# Patient Record
Sex: Female | Born: 1955 | Race: White | Hispanic: No | Marital: Married | Smoking: Never smoker
Health system: Southern US, Community
[De-identification: ages and names within clinical notes are randomized; demographics above are authoritative.]

## PROBLEM LIST (undated history)

## (undated) DIAGNOSIS — I10 Essential (primary) hypertension: Secondary | ICD-10-CM

## (undated) DIAGNOSIS — M797 Fibromyalgia: Secondary | ICD-10-CM

## (undated) DIAGNOSIS — M069 Rheumatoid arthritis, unspecified: Secondary | ICD-10-CM

## (undated) DIAGNOSIS — M8580 Other specified disorders of bone density and structure, unspecified site: Secondary | ICD-10-CM

## (undated) DIAGNOSIS — E119 Type 2 diabetes mellitus without complications: Secondary | ICD-10-CM

## (undated) HISTORY — PX: COLONOSCOPY: SHX174

## (undated) HISTORY — DX: Rheumatoid arthritis, unspecified: M06.9

## (undated) HISTORY — DX: Essential (primary) hypertension: I10

## (undated) HISTORY — PX: BREAST EXCISIONAL BIOPSY: SUR124

## (undated) HISTORY — DX: Other specified disorders of bone density and structure, unspecified site: M85.80

## (undated) HISTORY — PX: BREAST SURGERY: SHX581

## (undated) HISTORY — DX: Fibromyalgia: M79.7

## (undated) HISTORY — DX: Type 2 diabetes mellitus without complications: E11.9

## (undated) HISTORY — PX: ESOPHAGOGASTRODUODENOSCOPY: SHX1529

## (undated) HISTORY — PX: PARTIAL HYSTERECTOMY: SHX80

## (undated) HISTORY — PX: WISDOM TOOTH EXTRACTION: SHX21

## (undated) HISTORY — PX: TUBAL LIGATION: SHX77

---

## 2008-09-01 ENCOUNTER — Ambulatory Visit: Payer: Self-pay | Admitting: Cardiology

## 2008-09-10 ENCOUNTER — Ambulatory Visit: Payer: Self-pay | Admitting: Cardiology

## 2009-06-03 ENCOUNTER — Ambulatory Visit: Payer: Self-pay | Admitting: Cardiology

## 2011-04-18 NOTE — Assessment & Plan Note (Signed)
[facility]                          [facility]ICE NOTE   Betty, Wright                    MRN:          [id number]  DATE:09/01/2008                            DOB:          1956/07/08    REFERRING PHYSICIAN:  Kirstie Peri, MD   REASON FOR VISIT:  Palpitations.   HISTORY OF PRESENT ILLNESS:  Betty Wright is a 55 year old female patient  with no known history of CAD, but significant risk factors including  diabetes mellitus (x1 year) and dyslipidemia, who presents to the office  today for further evaluation of palpitations.  She had noted  palpitations for many years.  Over the last year, her palpitations seem  to have gotten worse, she notes them especially with lying down at  night.  It feels like her heart beat is irregular.  She also notes  skipped beats as well as some fast heart beats.  She denies any  associated syncope or near syncope or lightheadedness.  She denies any  associated chest pain or shortness of breath.  She denies any history of  exertional chest heaviness or tightness, arm or jaw discomfort, nausea,  diaphoresis, or shortness of breath.  She denies orthopnea, PND, or  pedal edema.  She does note an episode of dysphagia a couple of weeks  ago.  She apparently has a significant history of GERD.  She did have  some associated chest pressure and near syncope with this episode of  dysphagia, but it clearly happened while she was eating and she has not  had any recurrence since then.  She has had some episodes of positional  dizziness.  This does not seem to be related to her palpitations.  She  saw Dr. Sherryll Burger to evaluate her palpitations.  She wore a monitor for 2  weeks.  We have the final report today, but no strips to review.  The  summary does indicate that she basically had sinus rhythm and sinus  tachycardia with occasional PACs and atrial runs and sinus arrhythmia.  The patient was offered a beta-blocker, but  she preferred to have  further evaluation with a cardiologist first.   PAST MEDICAL HISTORY:  She has a history of diabetes mellitus x1 year.  She has a prior history of gestational diabetes.  She has hyperlipidemia  and currently takes medical therapy for this.  She has a history of  GERD, allergic rhinitis, fibromyalgia, and degenerative joint disease.  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  She is status post left breast lumpectomy and C-section as well  as bilateral tubal ligation.  She had scarlet fever as a child and also  has lactose intolerance.  The patient notes a history of stress testing  about 8 years ago secondary to chest pain.  She tells me this was  normal.   ALLERGIES:  PENICILLIN causes a rash.   MEDICATIONS:  Optivar eye drops b.i.d., Pepcid b.i.d., Claritin 10 mg  daily, Simvastatin 20 mg daily, Aspirin 81 mg daily, Vitamin D 1000 mg  b.i.d., Vitamin B6 100 mg daily,  Calcium plus  vitamin D 250 international units daily, Metformin 500 mg  half tablet daily, Triamterene/hydrochlorothiazide p.r.n.   SOCIAL HISTORY:  The patient denies ever smoking cigarettes.  She does  not abuse alcohol.  She is married with two children.  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   FAMILY HISTORY:  Unknown secondary to adoption.   REVIEW OF SYSTEMS:  Please see HPI.  She denies any fevers, chills,  cough, melena, hematochezia, hematuria, or dysuria.  Rest of the review  of systems is negative.   PHYSICAL EXAMINATION:  GENERAL:  She is a well-nourished, well-developed  female.  VITAL SIGNS:  Blood pressure is 145/90, pulse 81, weight 152.8 pounds.  HEENT:  Normal.  NECK:  Without JVD.  LYMPHATIC:  Without lymphadenopathy.  ENDOCRINE:  Without thyromegaly.  CARDIAC:  Normal S1 and S2.  Regular rate and rhythm.  LUNGS:  Clear to auscultation bilaterally.  ABDOMEN:  Soft, nontender.  EXTREMITIES:  Without edema.  NEUROLOGIC:  She is  alert and oriented x3.  Cranial nerves II through  XII grossly intact.  VASCULAR:  Carotids without bruits bilaterally.  Dorsalis pedis and  posterior tibialis pulses are 2+ bilaterally.   Electrocardiogram in the office today reveals sinus rhythm with a heart  rate of 74, normal axis, no acute changes.   ASSESSMENT/PLAN:  1. Palpitations in a 55 year old female patient with a history of      diabetes mellitus and hyperlipidemia.  Recent event monitoring      apparently demonstrated premature atrial contractions and brief      atrial tachycardia.  As outlined above, we will try to obtain her      strips from Dr. Margaretmary Eddy office.  The patient is not significantly      symptomatic with her palpitations to the point that she wants      medication.  I had a long discussion with her today regarding      possibly taking daily beta-blocker versus p.r.n. beta-blockers.  At      this point in time, she prefers to take nothing.  This is certainly      reasonable and I explained to her that she is not required to take      any medication for this benign arrhythmia.  She will have an      echocardiogram performed to assess left ventricular structure and      function.  We will bring her back in several months to reassess her      symptoms and make further recommendations at that time if felt to      be necessary.  2. Elevated blood pressure.  She typically has blood pressures in the      120/80 range.  This is likely a spurious result and she can follow      up further with Dr. Sherryll Burger.   DISPOSITION:  The patient will be brought back in followup with Dr.  Diona Browner in the next 6 months or sooner p.r.n.  The patient was also  interviewed and examined by Dr. Diona Browner.      Tereso Newcomer, PA-C  Electronically Signed      Jonelle Sidle, MD  Electronically Signed   SW/MedQ  DD: 09/01/2008  DT: 09/02/2008  Job #: [id number]   cc:   Kirstie Peri, MD

## 2017-01-18 ENCOUNTER — Ambulatory Visit (INDEPENDENT_AMBULATORY_CARE_PROVIDER_SITE_OTHER): Payer: 59 | Admitting: Otolaryngology

## 2017-01-18 DIAGNOSIS — J342 Deviated nasal septum: Secondary | ICD-10-CM

## 2017-01-18 DIAGNOSIS — J343 Hypertrophy of nasal turbinates: Secondary | ICD-10-CM

## 2017-01-18 DIAGNOSIS — J310 Chronic rhinitis: Secondary | ICD-10-CM | POA: Diagnosis not present

## 2017-01-19 ENCOUNTER — Other Ambulatory Visit (INDEPENDENT_AMBULATORY_CARE_PROVIDER_SITE_OTHER): Payer: Self-pay | Admitting: Otolaryngology

## 2017-01-19 DIAGNOSIS — J329 Chronic sinusitis, unspecified: Secondary | ICD-10-CM

## 2017-01-29 ENCOUNTER — Ambulatory Visit (HOSPITAL_COMMUNITY)
Admission: RE | Admit: 2017-01-29 | Discharge: 2017-01-29 | Disposition: A | Discharge status: Home / Self Care | Payer: 59 | Source: Ambulatory Visit | Attending: Otolaryngology | Admitting: Otolaryngology

## 2017-01-29 DIAGNOSIS — J342 Deviated nasal septum: Secondary | ICD-10-CM | POA: Insufficient documentation

## 2017-01-29 DIAGNOSIS — J329 Chronic sinusitis, unspecified: Secondary | ICD-10-CM

## 2017-01-29 DIAGNOSIS — J3489 Other specified disorders of nose and nasal sinuses: Secondary | ICD-10-CM | POA: Diagnosis not present

## 2017-01-29 DIAGNOSIS — J320 Chronic maxillary sinusitis: Secondary | ICD-10-CM | POA: Diagnosis present

## 2017-02-08 ENCOUNTER — Ambulatory Visit (INDEPENDENT_AMBULATORY_CARE_PROVIDER_SITE_OTHER): Payer: 59 | Admitting: Otolaryngology

## 2017-02-08 DIAGNOSIS — J310 Chronic rhinitis: Secondary | ICD-10-CM

## 2017-02-08 DIAGNOSIS — J342 Deviated nasal septum: Secondary | ICD-10-CM

## 2017-02-08 DIAGNOSIS — J343 Hypertrophy of nasal turbinates: Secondary | ICD-10-CM

## 2017-08-16 ENCOUNTER — Ambulatory Visit (INDEPENDENT_AMBULATORY_CARE_PROVIDER_SITE_OTHER): Payer: 59 | Admitting: Otolaryngology

## 2017-08-16 DIAGNOSIS — R040 Epistaxis: Secondary | ICD-10-CM

## 2017-08-16 DIAGNOSIS — J343 Hypertrophy of nasal turbinates: Secondary | ICD-10-CM | POA: Diagnosis not present

## 2017-08-16 DIAGNOSIS — J342 Deviated nasal septum: Secondary | ICD-10-CM | POA: Diagnosis not present

## 2019-07-22 ENCOUNTER — Encounter: Payer: Self-pay | Admitting: Neurology

## 2019-08-20 NOTE — Progress Notes (Signed)
NEUROLOGY CONSULTATION NOTE  KASSANDRA MERIWEATHER MRN: [id number] DOB: 21-Aug-1956  Referring provider: Kirstie Peri, MD Primary care provider: Kirstie Peri, MD  Reason for consult:  Abnormal MRI  HISTORY OF PRESENT ILLNESS: Betty Wright is a 63 year old female with rheumatoid arthritis, CKD, type 2 diabetes mellitus, and fibromyalgia who presents for abnormal MRI.  History supplemented by referring provider note.  She has rheumatoid arthritis, type 2 diabetes mellitus, and fibromyalgia.    About 10 years ago, she was having stabbing headaches.  She saw a neurologist.  She has an MRI of the brain at the time.  She was told that the "saw something" and recommended repeating the MRI in a year.  She never followed up   She started noticing difficulty walking up the stairs about 2 years ago.  It has since progressively gotten worse.  She now needs to pull herself up with the bannister.  When she walks, she sometimes veers to either side.  She reports generalized fatigue as well.  2 years ago, she noticed that she had trouble seeing out of her right eye.  She saw an optometrist who said she didn't need new glasses but that she "just can't see well out of that eye" with no further explanation.  No numbness and tingling.  Sometimes her feet would cramp and evert and toes would curl.  She has occasional restless leg and sometimes has tight aching discomfort in her left leg.  She reports loose stool every morning, which her PCP attributes to the metformin.  No bladder dysfunction.  No problems with swallowing or talking.  No significant dizziness.    She had an MRI of the brain without contrast performed on 07/01/19, which showed multiple nonspecific moderate size areas of T2 hyperintensity in the periventricular and juxtacortical white matter concerning for late subacute infarcts or demyelinating process.  Follow up MRI of brain with contrast performed 08/06/19 was unchanged without enhancement.    She  was adopted, so her family history is unknown.    PAST MEDICAL HISTORY: Past Medical History:  Diagnosis Date  . Diabetes (HCC)   . Fibromyalgia   . Rheumatoid arthritis (HCC)     PAST SURGICAL HISTORY: Past Surgical History:  Procedure Laterality Date  . CESAREAN SECTION     times two  . PARTIAL HYSTERECTOMY    . TUBAL LIGATION      MEDICATIONS: Outpatient Encounter Medications as of 08/21/2019  Medication Sig  . folic acid (FOLVITE) 1 MG tablet TAKE 1 (ONE) TABLET DAILY, MAX DAILY DOSE  1 TABLET  . lansoprazole (PREVACID) 30 MG capsule Take 30 mg by mouth daily at 12 noon. Pt unsure of dose  . metFORMIN (GLUCOPHAGE-XR) 500 MG 24 hr tablet Take 1,000 mg by mouth 2 (two) times daily.  . methotrexate (RHEUMATREX) 2.5 MG tablet 4 tabs weekly  . triamterene-hydrochlorothiazide (MAXZIDE-25) 37.5-25 MG tablet TAKE 1 AND 1/2 TABLETS DAILY BY MOUTH   No facility-administered encounter medications on file as of 08/21/2019.      ALLERGIES: Allergies  Allergen Reactions  . Influenza A (H1n1) Monovalent Vaccine     She wasn't sure which one but cant take the flu shot - arm became swollen.  . Penicillins Rash    FAMILY HISTORY: History reviewed. No pertinent family history..  SOCIAL HISTORY: Social History   Socioeconomic History  . Marital status: Married    Spouse name: Not on file  . Number of children: Not on file  . Years of  education: Not on file  . Highest education level: Not on file  Occupational History  . Not on file  Social Needs  . Financial resource strain: Not on file  . Food insecurity    Worry: Not on file    Inability: Not on file  . Transportation needs    Medical: Not on file    Non-medical: Not on file  Tobacco Use  . Smoking status: Not on file  Substance and Sexual Activity  . Alcohol use: Not on file  . Drug use: Not on file  . Sexual activity: Not on file  Lifestyle  . Physical activity    Days per week: Not on file    Minutes per  session: Not on file  . Stress: Not on file  Relationships  . Social Herbalist on phone: Not on file    Gets together: Not on file    Attends religious service: Not on file    Active member of club or organization: Not on file    Attends meetings of clubs or organizations: Not on file    Relationship status: Not on file  . Intimate partner violence    Fear of current or ex partner: Not on file    Emotionally abused: Not on file    Physically abused: Not on file    Forced sexual activity: Not on file  Other Topics Concern  . Not on file  Social History Narrative   3 level home with husband; R handed ; high school grad; 1 cup coffee every am; exercise not much;     REVIEW OF SYSTEMS: Constitutional: No fevers, chills, or sweats, no generalized fatigue, change in appetite Eyes: No visual changes, double vision, eye pain Ear, nose and throat: No hearing loss, ear pain, nasal congestion, sore throat Cardiovascular: No chest pain, palpitations Respiratory:  No shortness of breath at rest or with exertion, wheezes GastrointestinaI: No nausea, vomiting, diarrhea, abdominal pain, fecal incontinence Genitourinary:  No dysuria, urinary retention or frequency Musculoskeletal:  No neck pain, back pain Integumentary: No rash, pruritus, skin lesions Neurological: as above Psychiatric: No depression, insomnia, anxiety Endocrine: No palpitations, fatigue, diaphoresis, mood swings, change in appetite, change in weight, increased thirst Hematologic/Lymphatic:  No purpura, petechiae. Allergic/Immunologic: no itchy/runny eyes, nasal congestion, recent allergic reactions, rashes  PHYSICAL EXAM: Blood pressure 130/70, pulse 82, temperature 98.5 F (36.9 C), height 5\' 2"  (1.575 m), weight 157 lb (71.2 kg), SpO2 98 %. General: No acute distress.  Patient appears well-groomed.   Head:  Normocephalic/atraumatic Eyes:  fundi examined but not visualized Neck: supple, no paraspinal  tenderness, full range of motion Back: No paraspinal tenderness Heart: regular rate and rhythm Lungs: Clear to auscultation bilaterally. Vascular: No carotid bruits. Neurological Exam: Mental status: alert and oriented to person, place, and time, recent and remote memory intact, fund of knowledge intact, attention and concentration intact, speech fluent and not dysarthric, language intact. Cranial nerves: CN I: not tested CN II: pupils equal, round and reactive to light, visual fields intact CN III, IV, VI:  full range of motion, no nystagmus, no ptosis CN V: facial sensation intact CN VII: upper and lower face symmetric CN VIII: hearing intact CN IX, X: gag intact, uvula midline CN XI: sternocleidomastoid and trapezius muscles intact CN XII: tongue midline Bulk & Tone: normal, no fasciculations. Motor:  5/5 throughout  Sensation:  Pinprick and vibration sensation intact. Deep Tendon Reflexes:  3+ throughout, toes downgoing.   Finger to  nose testing:  Without dysmetria.   Heel to shin:  Without dysmetria.   Gait:  Normal station and stride but slightly veers to the left.  Able to turn, unable to tandem walk. Romberg negative.  IMPRESSION: 1.  Abnormal white matter of brain MRI.   2.  Bilateral leg weakness.  No objective weakness appreciated on exam.  Unclear if may be related to rheumatoid arthritis or fibromyalgia.   3.  Unsteady gait.  She did seem to veer slightly to the left but no significant gait abnormality.  Imaging not available for review.  Findings reportedly nonspecific but given her other symptoms, still potentially concerning for demyelinating disease.  She does have hyperreflexia, which may be physiologic, but also may be a symptoms of myelopathy.  I think further evaluation of the cervical spinal cord to evaluate for any abnormal cord signal is warranted.  PLAN: 1.  Will obtain imaging of brain MRI with and without contrast 2.  MRI of cervical spine with and without  contrast 3.  Labs:  CK, B12, TSH 4.  Further recommendations pending results.  If cervical unremarkable, then would recommend repeat MRI of brain with and without contrast in one year.  Thank you for allowing me to take part in the care of this patient.  Shon Millet, DO  CC: Kirstie Peri, MD

## 2019-08-21 ENCOUNTER — Encounter: Payer: Self-pay | Admitting: *Deleted

## 2019-08-21 ENCOUNTER — Other Ambulatory Visit (INDEPENDENT_AMBULATORY_CARE_PROVIDER_SITE_OTHER): Payer: 59

## 2019-08-21 ENCOUNTER — Other Ambulatory Visit: Payer: Self-pay

## 2019-08-21 ENCOUNTER — Telehealth: Payer: Self-pay | Admitting: *Deleted

## 2019-08-21 ENCOUNTER — Ambulatory Visit (INDEPENDENT_AMBULATORY_CARE_PROVIDER_SITE_OTHER): Payer: 59 | Admitting: Neurology

## 2019-08-21 ENCOUNTER — Encounter: Payer: Self-pay | Admitting: Neurology

## 2019-08-21 VITALS — BP 130/70 | HR 82 | Temp 98.5°F | Ht 62.0 in | Wt 157.0 lb

## 2019-08-21 DIAGNOSIS — R9082 White matter disease, unspecified: Secondary | ICD-10-CM

## 2019-08-21 DIAGNOSIS — R29898 Other symptoms and signs involving the musculoskeletal system: Secondary | ICD-10-CM | POA: Diagnosis not present

## 2019-08-21 DIAGNOSIS — R2681 Unsteadiness on feet: Secondary | ICD-10-CM

## 2019-08-21 DIAGNOSIS — R292 Abnormal reflex: Secondary | ICD-10-CM

## 2019-08-21 DIAGNOSIS — R5383 Other fatigue: Secondary | ICD-10-CM

## 2019-08-21 NOTE — Patient Instructions (Signed)
1.  Please mail the MRI CDs to me for review 2.  We will get MRI of cervical spine with and without contrast.  3.  We will check CK, B12, vitamin D, TSH 4.  Further recommendations pending results.

## 2019-08-21 NOTE — Telephone Encounter (Signed)
Called Courtney at [facility] to get MRI of patient. She left it at home. [facility] is  mailing it to our office.

## 2019-08-21 NOTE — Progress Notes (Signed)
Sent fax to [facility] for MRI cervical spine w and w/o contrast. Patient needs open MRI - noted on order. Fax 1 [phone].

## 2019-08-22 ENCOUNTER — Telehealth: Payer: Self-pay | Admitting: *Deleted

## 2019-08-22 ENCOUNTER — Encounter: Payer: Self-pay | Admitting: Neurology

## 2019-08-22 LAB — CK: Total CK: 31 U/L (ref 7–177)

## 2019-08-22 LAB — VITAMIN D 25 HYDROXY (VIT D DEFICIENCY, FRACTURES): VITD: 71.72 ng/mL (ref 30.00–100.00)

## 2019-08-22 LAB — VITAMIN B12: Vitamin B-12: 307 pg/mL (ref 211–911)

## 2019-08-22 LAB — TSH: TSH: 1.92 u[IU]/mL (ref 0.35–4.50)

## 2019-08-22 NOTE — Telephone Encounter (Signed)
Information from [employer] from [facility] regarding MRI ordered yesterday:  [phone] valid until 10/05/2019

## 2019-08-25 ENCOUNTER — Telehealth: Payer: Self-pay | Admitting: *Deleted

## 2019-08-25 NOTE — Telephone Encounter (Signed)
-----   Message from Pieter Partridge, DO sent at 08/25/2019  7:04 AM EDT ----- Labs are normal, however her B12 is in the low normal range which can present with B12 deficiency symptoms such as fatigue.  She may consider taking supplemental over the counter B12 1000 mcg daily.

## 2019-08-25 NOTE — Telephone Encounter (Signed)
Called patient and made aware of MD recommendation of OTC B12 1039mcg daily. Informed her labs were normal just B12 in low normal range which can present with B12 deficiency symptoms. Patient verbalized understanding.

## 2019-09-22 ENCOUNTER — Encounter: Payer: Self-pay | Admitting: Neurology

## 2019-09-25 ENCOUNTER — Telehealth: Payer: Self-pay

## 2019-09-25 NOTE — Telephone Encounter (Signed)
Called patient she was made aware of results. Pt understands  Reminder set for MRI Brain w & w/o for 1 year from now

## 2019-09-25 NOTE — Telephone Encounter (Signed)
-----   Message from Pieter Partridge, DO sent at 09/25/2019  7:27 AM EDT ----- Reviewed report of MRI cervical spine without contrast from 09/23/2019:  It reveals mild degenerative (arthritic) changes but no abnormality of the spinal cord.  My recommendation is to repeat MRI of brain with and without contrast in one year and follow up with me soon afterwards.

## 2021-02-14 NOTE — Progress Notes (Signed)
Office Visit Note  Patient: Betty Wright             Date of Birth: 1956-08-29           MRN: [id number]             PCP: Kirstie Peri, MD Referring: Kirstie Peri, MD Visit Date: 02/25/2021 Occupation: @GUAROCC @  Subjective:  Pain in joints  History of Present Illness: Betty Wright is a 65 y.o. female seen in the consultation per request of Dr. 76.  According the patient about 10 years ago she developed a rash on her elbows and her knee joints.  At the time she was seen at [facility] who did lab work and her sed rate was elevated.  Her ANA was also positive.  She was referred to rheumatologist and was not diagnosed with any autoimmune disease.  She has seen 3 rheumatologist at the time and the work-up was negative for lupus.  She saw Dr. Washington later who diagnosed her with rheumatoid arthritis and suggested medication but she did not start the medicine.  She states she continued to have discomfort in her feet.  About 40 years ago this discomfort got worse and she was having discomfort in her neck, hands, knees and her feet.  She was seen by Dr. Dierdre Forth in [place] who diagnosed her with rheumatoid arthritis and started on methotrexate 4 tablets/week.  She has been taking methotrexate since then and has noticed improvement in her symptoms.  She states she developed an upper respiratory tract infection about 2 months ago for which she was given Bactrim and she discontinued the methotrexate.  Her infection did not resolve and she was given a Z-Pak.  She states she started having increased pain and swelling in her hands while she was off methotrexate.  When she was given a steroid injection and prednisone taper for her upper respiratory tract infection the swelling improved.  She complains of some discomfort and swelling in her hands currently.  She continues to have discomfort in her neck, knee joints, feet and her left SI joint.  She states she has had cortisone injection x2 by  Dr. Summit in her knee joints.  Dr. Arbutus Leas is not on her insurance anymore specify she wants to establish her care here.  She was also diagnosed with fibromyalgia syndrome several years ago.  She continues to have fatigue and generalized pain from fibromyalgia.  Activities of Daily Living:  Patient reports morning stiffness for 20-30 minutes.   Patient Reports nocturnal pain.  Difficulty dressing/grooming: Denies Difficulty climbing stairs: Reports Difficulty getting out of chair: Denies Difficulty using hands for taps, buttons, cutlery, and/or writing: Denies  Review of Systems  Constitutional: Positive for fatigue.  HENT: Positive for nose dryness. Negative for mouth sores and mouth dryness.   Eyes: Positive for dryness. Negative for pain and itching.  Respiratory: Negative for shortness of breath and difficulty breathing.   Cardiovascular: Negative for chest pain and palpitations.  Gastrointestinal: Positive for diarrhea. Negative for blood in stool and constipation.  Endocrine: Negative for increased urination.  Genitourinary: Negative for difficulty urinating.  Musculoskeletal: Positive for arthralgias, joint pain, joint swelling, myalgias, morning stiffness, muscle tenderness and myalgias.  Skin: Negative for color change, rash and redness.  Allergic/Immunologic: Negative for susceptible to infections.  Neurological: Negative for dizziness, numbness, headaches and memory loss.  Hematological: Negative for bruising/bleeding tendency.  Psychiatric/Behavioral: Negative for confusion.    PMFS History:  There are no problems to display  for this patient.   Past Medical History:  Diagnosis Date  . Diabetes (HCC)   . Fibromyalgia   . Rheumatoid arthritis (HCC)     Family History  Adopted: Yes  Problem Relation Age of Onset  . Healthy Son   . Healthy Daughter    Past Surgical History:  Procedure Laterality Date  . BREAST SURGERY Left    lumpectomy   . CESAREAN SECTION      times two  . PARTIAL HYSTERECTOMY    . TUBAL LIGATION    . WISDOM TOOTH EXTRACTION     Social History   Social History Narrative   3 level home with husband; R handed ; high school grad; 1 cup coffee every am; exercise not much;     There is no immunization history on file for this patient.   Objective: Vital Signs: BP (!) 156/83 (BP Location: Left Arm, Patient Position: Sitting, Cuff Size: Normal)   Pulse 73   Resp 14   Ht 5\' 2"  (1.575 m)   Wt 160 lb 3.2 oz (72.7 kg)   BMI 29.30 kg/m    Physical Exam Vitals and nursing note reviewed.  Constitutional:      Appearance: She is well-developed.  HENT:     Head: Normocephalic and atraumatic.  Eyes:     Conjunctiva/sclera: Conjunctivae normal.  Cardiovascular:     Rate and Rhythm: Normal rate and regular rhythm.     Heart sounds: Normal heart sounds.  Pulmonary:     Effort: Pulmonary effort is normal.     Breath sounds: Normal breath sounds.  Abdominal:     General: Bowel sounds are normal.     Palpations: Abdomen is soft.  Musculoskeletal:     Cervical back: Normal range of motion.  Lymphadenopathy:     Cervical: No cervical adenopathy.  Skin:    General: Skin is warm and dry.     Capillary Refill: Capillary refill takes less than 2 seconds.  Neurological:     Mental Status: She is alert and oriented to person, place, and time.  Psychiatric:        Behavior: Behavior normal.      Musculoskeletal Exam: C-spine thoracic and lumbar spine were in good range of motion.  She has some tenderness over left SI joint.  Shoulder joints, elbow joints, wrist joints, MCPs PIPs and DIPs with good range of motion with no synovitis.  Hip joints, knee joints, ankles, MTPs and PIPs with good range of motion with no synovitis.  She has some discomfort range of motion of her left hip joint.  CDAI Exam: CDAI Score: 0.4  Patient Global: 4 mm; Provider Global: 0 mm Swollen: 0 ; Tender: 0  Joint Exam 02/25/2021   No joint exam has been  documented for this visit   There is currently no information documented on the homunculus. Go to the Rheumatology activity and complete the homunculus joint exam.  Investigation: No additional findings.  Imaging: No results found.  Recent Labs: No results found for: WBC, HGB, PLT, NA, K, CL, CO2, GLUCOSE, BUN, CREATININE, BILITOT, ALKPHOS, AST, ALT, PROT, ALBUMIN, CALCIUM, GFRAA, QFTBGOLD, QFTBGOLDPLUS  Speciality Comments: No specialty comments available.  Procedures:  No procedures performed Allergies: Erythromycin, Flonase [fluticasone], Influenza a (h1n1) monovalent vaccine, and Penicillins   Assessment / Plan:     Visit Diagnoses: Rheumatoid arthritis involving multiple sites, unspecified whether rheumatoid factor present (HCC) - Seen by Dr. 02/27/2021 longer accept insurance.  Patient was diagnosed with rheumatoid arthritis  by Dr. Octaviano Glow 4 years ago.  She has been on methotrexate 4 tablets p.o. weekly since then.  She states the methotrexate worked really well for her.  She has been off methotrexate due to recent infection.  She is been off methotrexate for 2 months now.  I do not see any synovitis on my examination today.  She states she was given a prednisone taper and a steroid shot which took the inflammation away.  She complains of some discomfort in her left hip joint and left SI joint.  None of the other joints are painful or swollen today.  I will obtain following labs today.  If the labs are normal we can consider putting her back on methotrexate in the future.  A handout was given and consent was taken.  Side effects of methotrexate were discussed at length.- Plan: Sedimentation rate, Rheumatoid factor, Cyclic citrul peptide antibody, IgG  Drug Counseling TB Gold: Pending Hepatitis panel: Pending  Chest-xray: Pending  Contraception: Not applicable  Alcohol use: none  Patient was counseled on the purpose, proper use, and adverse effects of methotrexate including  nausea, infection, and signs and symptoms of pneumonitis.  Reviewed instructions with patient to take methotrexate weekly along with folic acid daily.  Discussed the importance of frequent monitoring of kidney and liver function and blood counts, and provided patient with standing lab instructions.  Counseled patient to avoid NSAIDs and alcohol while on methotrexate.  Provided patient with educational materials on methotrexate and answered all questions.  Advised patient to get annual influenza vaccine and to get a pneumococcal vaccine if patient has not already had one.  Patient voiced understanding.  Patient consented to methotrexate use.  Will upload into chart.    High risk medication use -in anticipation to start her on immunosuppressive therapy following labs will be obtained.  Plan: CBC with Differential/Platelet, COMPLETE METABOLIC PANEL WITH GFR, Hepatitis B core antibody, IgM, Hepatitis B surface antigen, Hepatitis C antibody, HIV Antibody (routine testing w rflx), QuantiFERON-TB Gold Plus, Serum protein electrophoresis with reflex, IgG, IgA, IgM, I will obtain baseline chest x-ray.  DG Chest 2 View.  She has not received COVID-19 immunization.  Instructions were placed in the AVS.  Pain in both hands -complains of pain and discomfort in the bilateral hands.  No synovitis was noted.  Plan: XR Hand 2 View Right, XR Hand 2 View Left.  X-ray showed generalized osteopenia and early osteoarthritic changes.  Chronic pain of both knees -she complains of discomfort in her bilateral knee joints.  She states she has had cortisone injection to her knee joints in the past by Dr. Dorathy Kinsman.Plan: XR KNEE 3 VIEW RIGHT, XR KNEE 3 VIEW LEFT.  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  Pain in both feet -she complains of discomfort in her feet.  No warmth swelling effusion was noted.  Plan: XR Foot 2 Views Right, x-ray was consistent with osteoarthritic changes.  Chronic left SI joint  pain -she complains of chronic left SI joint pain.  She has had cortisone injection in the past.  Plan: XR Pelvis 1-2 Views.  X-ray of bilateral SI joints and hip joints are normal  Positive ANA (antinuclear antibody) - No autoimmune lab work.  "rash in early 2000s and positive ANA at that time" - Plan: ANA  Fibromyalgia -she complains of generalized pain and myalgias.  She states she was diagnosed with fibromyalgia syndrome by Dr. Dierdre Forth and Dr. Dorathy Kinsman. Plan: CK  Age-related osteoporosis without current pathological fracture-I  do not have DEXA available.  Patient is not aware that she has osteoporosis.  Have advised her to discuss this further with her PCP.  Vitamin D deficiency-I do not have vitamin D level available.  Essential hypertension-her systolic blood pressure was elevated.  Have advised her to monitor blood pressure closely.  It could be related to recent steroid intake.  History of hyperlipidemia-increased risk of heart disease with the rheumatoid arthritis was discussed.  Dietary modifications and exercise were discussed in the handout was placed in the AVS.  History of type 2 diabetes mellitus  History of chronic kidney disease-patient is not aware of chronic kidney disease.  History of gastroesophageal reflux (GERD)  Orders: Orders Placed This Encounter  Procedures  . XR Hand 2 View Right  . XR Hand 2 View Left  . XR KNEE 3 VIEW RIGHT  . XR KNEE 3 VIEW LEFT  . XR Foot 2 Views Right  . XR Foot 2 Views Left  . XR Pelvis 1-2 Views  . DG Chest 2 View  . CBC with Differential/Platelet  . COMPLETE METABOLIC PANEL WITH GFR  . CK  . Sedimentation rate  . Rheumatoid factor  . Cyclic citrul peptide antibody, IgG  . Hepatitis B core antibody, IgM  . Hepatitis B surface antigen  . Hepatitis C antibody  . HIV Antibody (routine testing w rflx)  . QuantiFERON-TB Gold Plus  . Serum protein electrophoresis with reflex  . IgG, IgA, IgM  . ANA   No orders of the defined  types were placed in this encounter.   Follow-Up Instructions: Return for Rheumatoid arthritis.   Pollyann Savoy, MD  Note - This record has been created using Animal nutritionist.  Chart creation errors have been sought, but may not always  have been located. Such creation errors do not reflect on  the standard of medical care.

## 2021-02-25 ENCOUNTER — Other Ambulatory Visit: Payer: Self-pay

## 2021-02-25 ENCOUNTER — Encounter: Payer: Self-pay | Admitting: Rheumatology

## 2021-02-25 ENCOUNTER — Ambulatory Visit: Payer: Self-pay

## 2021-02-25 ENCOUNTER — Ambulatory Visit (INDEPENDENT_AMBULATORY_CARE_PROVIDER_SITE_OTHER): Payer: Medicare Other | Admitting: Rheumatology

## 2021-02-25 VITALS — BP 156/83 | HR 73 | Resp 14 | Ht 62.0 in | Wt 160.2 lb

## 2021-02-25 DIAGNOSIS — Z79899 Other long term (current) drug therapy: Secondary | ICD-10-CM | POA: Diagnosis not present

## 2021-02-25 DIAGNOSIS — M797 Fibromyalgia: Secondary | ICD-10-CM

## 2021-02-25 DIAGNOSIS — M069 Rheumatoid arthritis, unspecified: Secondary | ICD-10-CM | POA: Diagnosis not present

## 2021-02-25 DIAGNOSIS — M533 Sacrococcygeal disorders, not elsewhere classified: Secondary | ICD-10-CM

## 2021-02-25 DIAGNOSIS — Z8719 Personal history of other diseases of the digestive system: Secondary | ICD-10-CM

## 2021-02-25 DIAGNOSIS — M79671 Pain in right foot: Secondary | ICD-10-CM

## 2021-02-25 DIAGNOSIS — Z87448 Personal history of other diseases of urinary system: Secondary | ICD-10-CM

## 2021-02-25 DIAGNOSIS — M810 Age-related osteoporosis without current pathological fracture: Secondary | ICD-10-CM

## 2021-02-25 DIAGNOSIS — M25562 Pain in left knee: Secondary | ICD-10-CM | POA: Diagnosis not present

## 2021-02-25 DIAGNOSIS — G8929 Other chronic pain: Secondary | ICD-10-CM | POA: Diagnosis not present

## 2021-02-25 DIAGNOSIS — M79672 Pain in left foot: Secondary | ICD-10-CM | POA: Diagnosis not present

## 2021-02-25 DIAGNOSIS — M79641 Pain in right hand: Secondary | ICD-10-CM

## 2021-02-25 DIAGNOSIS — M79642 Pain in left hand: Secondary | ICD-10-CM | POA: Diagnosis not present

## 2021-02-25 DIAGNOSIS — Z8639 Personal history of other endocrine, nutritional and metabolic disease: Secondary | ICD-10-CM

## 2021-02-25 DIAGNOSIS — I10 Essential (primary) hypertension: Secondary | ICD-10-CM

## 2021-02-25 DIAGNOSIS — R768 Other specified abnormal immunological findings in serum: Secondary | ICD-10-CM

## 2021-02-25 DIAGNOSIS — E559 Vitamin D deficiency, unspecified: Secondary | ICD-10-CM

## 2021-02-25 DIAGNOSIS — M25561 Pain in right knee: Secondary | ICD-10-CM

## 2021-02-25 NOTE — Patient Instructions (Signed)
COVID-19 vaccine recommendations:   COVID-19 vaccine is recommended for everyone (unless you are allergic to a vaccine component), even if you are on a medication that suppresses your immune system.   If you are on Methotrexate, Cellcept (mycophenolate), Rinvoq, Harriette Ohara, and Olumiant- hold the medication for 1 week after each vaccine. Hold Methotrexate for 2 weeks after the single dose COVID-19 vaccine.   If you are on Orencia subcutaneous injection - hold medication one week prior to and one week after the first COVID-19 vaccine dose (only).   If you are on Orencia IV infusions- time vaccination administration so that the first COVID-19 vaccination will occur four weeks after the infusion and postpone the subsequent infusion by one week.   If you are on Cyclophosphamide or Rituxan infusions please contact your doctor prior to receiving the COVID-19 vaccine.   Do not take Tylenol or any anti-inflammatory medications (NSAIDs) 24 hours prior to the COVID-19 vaccination.   There is no direct evidence about the efficacy of the COVID-19 vaccine in individuals who are on medications that suppress the immune system.   Even if you are fully vaccinated, and you are on any medications that suppress your immune system, please continue to wear a mask, maintain at least six feet social distance and practice hand hygiene.   If you develop a COVID-19 infection, please contact your PCP or our office to determine if you need monoclonal antibody infusion.  The booster vaccine is now available for immunocompromised patients.   Please see the following web sites for updated information.   [email or web address]  Vaccines You are taking a medication(s) that can suppress your immune system.  The following immunizations are recommended: . Flu annually . Covid-19  . Pneumonia (Pneumovax 23 and Prevnar 13 spaced at least 1 year apart) . Shingrix  (after age 78)  Please check with your PCP to make sure you are up to date.  Heart Disease Prevention   Your inflammatory disease increases your risk of heart disease which includes heart attack, stroke, atrial fibrillation (irregular heartbeats), high blood pressure, heart failure and atherosclerosis (plaque in the arteries).  It is important to reduce your risk by:   . Keep blood pressure, cholesterol, and blood sugar at healthy levels   . Smoking Cessation   . Maintain a healthy weight  o BMI 20-25   . Eat a healthy diet  o Plenty of fresh fruit, vegetables, and whole grains  o Limit saturated fats, foods high in sodium, and added sugars  o DASH and Mediterranean diet   . Increase physical activity  o Recommend moderate physically activity for 150 minutes per week/ 30 minutes a day for five days a week These can be broken up into three separate ten-minute sessions during the day.   . Reduce Stress  . Meditation, slow breathing exercises, yoga, coloring books  . Dental visits twice a year   Methotrexate tablets What is this medicine? METHOTREXATE (METH oh TREX ate) is a chemotherapy drug used to treat cancer including breast cancer, leukemia, and lymphoma. This medicine can also be used to treat psoriasis and certain kinds of arthritis. This medicine may be used for other purposes; ask your health care provider or pharmacist if you have questions. COMMON BRAND NAME(S): Rheumatrex, Trexall What should I tell my health care provider before I take this medicine? They need to know if you have any of these conditions:  fluid in the stomach area or lungs  if you often drink alcohol  infection  or immune system problems  kidney disease or on hemodialysis  liver disease  low blood counts, like low white cell, platelet, or red cell counts  lung disease  radiation therapy  stomach ulcers  ulcerative colitis  an unusual or allergic reaction to methotrexate, other  medicines, foods, dyes, or preservatives  pregnant or trying to get pregnant  breast-feeding How should I use this medicine? Take this medicine by mouth with a glass of water. Follow the directions on the prescription label. Take your medicine at regular intervals. Do not take it more often than directed. Do not stop taking except on your doctor's advice. Make sure you know why you are taking this medicine and how often you should take it. If this medicine is used for a condition that is not cancer, like arthritis or psoriasis, it should be taken weekly, NOT daily. Taking this medicine more often than directed can cause serious side effects, even death. Talk to your healthcare provider about safe handling and disposal of this medicine. You may need to take special precautions. Talk to your pediatrician regarding the use of this medicine in children. While this drug may be prescribed for selected conditions, precautions do apply. Overdosage: If you think you have taken too much of this medicine contact a poison control center or emergency room at once. NOTE: This medicine is only for you. Do not share this medicine with others. What if I miss a dose? If you miss a dose, talk with your doctor or health care professional. Do not take double or extra doses. What may interact with this medicine? Do not take this medicine with any of the following medications:  acitretin This medicine may also interact with the following medication:  aspirin and aspirin-like medicines including salicylates  azathioprine  certain antibiotics like penicillins, tetracycline, and chloramphenicol  certain medicines that treat or prevent blood clots like warfarin, apixaban, dabigatran, and rivaroxaban  certain medicines for stomach problems like esomeprazole, omeprazole, pantoprazole  cyclosporine  dapsone  diuretics  gold  hydroxychloroquine  live virus vaccines  medicines for infection like acyclovir,  adefovir, amphotericin B, bacitracin, cidofovir, foscarnet, ganciclovir, gentamicin, pentamidine, vancomycin  mercaptopurine  NSAIDs, medicines for pain and inflammation, like ibuprofen or naproxen  other cytotoxic agents  pamidronate  pemetrexed  penicillamine  phenylbutazone  phenytoin  probenecid  pyrimethamine  retinoids such as isotretinoin and tretinoin  steroid medicines like prednisone or cortisone  sulfonamides like sulfasalazine and trimethoprim/sulfamethoxazole  theophylline  zoledronic acid This list may not describe all possible interactions. Give your health care provider a list of all the medicines, herbs, non-prescription drugs, or dietary supplements you use. Also tell them if you smoke, drink alcohol, or use illegal drugs. Some items may interact with your medicine. What should I watch for while using this medicine? Avoid alcoholic drinks. This medicine can make you more sensitive to the sun. Keep out of the sun. If you cannot avoid being in the sun, wear protective clothing and use sunscreen. Do not use sun lamps or tanning beds/booths. You may need blood work done while you are taking this medicine. Call your doctor or health care professional for advice if you get a fever, chills or sore throat, or other symptoms of a cold or flu. Do not treat yourself. This drug decreases your body's ability to fight infections. Try to avoid being around people who are sick. This medicine may increase your risk to bruise or bleed. Call your doctor or health care professional if you notice  any unusual bleeding. Be careful brushing or flossing your teeth or using a toothpick because you may get an infection or bleed more easily. If you have any dental work done, tell your dentist you are receiving this medicine. Check with your doctor or health care professional if you get an attack of severe diarrhea, nausea and vomiting, or if you sweat a lot. The loss of too much body  fluid can make it dangerous for you to take this medicine. Talk to your doctor about your risk of cancer. You may be more at risk for certain types of cancers if you take this medicine. Do not become pregnant while taking this medicine or for 6 months after stopping it. Women should inform their health care provider if they wish to become pregnant or think they might be pregnant. Men should not father a child while taking this medicine and for 3 months after stopping it. There is potential for serious harm to an unborn child. Talk to your health care provider for more information. Do not breast-feed an infant while taking this medicine or for 1 week after stopping it. This medicine may make it more difficult to get pregnant or father a child. Talk to your health care provider if you are concerned about your fertility. What side effects may I notice from receiving this medicine? Side effects that you should report to your doctor or health care professional as soon as possible:  allergic reactions like skin rash, itching or hives, swelling of the face, lips, or tongue  breathing problems or shortness of breath  diarrhea  dry, nonproductive cough  low blood counts - this medicine may decrease the number of white blood cells, red blood cells and platelets. You may be at increased risk for infections and bleeding.  mouth sores  redness, blistering, peeling or loosening of the skin, including inside the mouth  signs of infection - fever or chills, cough, sore throat, pain or trouble passing urine  signs and symptoms of bleeding such as bloody or black, tarry stools; red or dark-brown urine; spitting up blood or brown material that looks like coffee grounds; red spots on the skin; unusual bruising or bleeding from the eye, gums, or nose  signs and symptoms of kidney injury like trouble passing urine or change in the amount of urine  signs and symptoms of liver injury like dark yellow or brown  urine; general ill feeling or flu-like symptoms; light-colored stools; loss of appetite; nausea; right upper belly pain; unusually weak or tired; yellowing of the eyes or skin Side effects that usually do not require medical attention (report to your doctor or health care professional if they continue or are bothersome):  dizziness  hair loss  tiredness  upset stomach  vomiting This list may not describe all possible side effects. Call your doctor for medical advice about side effects. You may report side effects to FDA at 1-800-FDA-1088. Where should I keep my medicine? Keep out of the reach of children and pets. Store at room temperature between 20 and 25 degrees C (68 and 77 degrees F). Protect from light. Get rid of any unused medicine after the expiration date. Talk to your health care provider about how to dispose of unused medicine. Special directions may apply. NOTE: This sheet is a summary. It may not cover all possible information. If you have questions about this medicine, talk to your doctor, pharmacist, or health care provider.  2021 Elsevier/Gold Standard (2020-06-21 10:40:39)

## 2021-02-28 ENCOUNTER — Ambulatory Visit (HOSPITAL_COMMUNITY)
Admission: RE | Admit: 2021-02-28 | Discharge: 2021-02-28 | Disposition: A | Discharge status: Home / Self Care | Payer: Medicare Other | Source: Ambulatory Visit | Attending: Rheumatology | Admitting: Rheumatology

## 2021-02-28 ENCOUNTER — Other Ambulatory Visit: Payer: Self-pay

## 2021-02-28 ENCOUNTER — Other Ambulatory Visit: Payer: Self-pay | Admitting: *Deleted

## 2021-02-28 DIAGNOSIS — Z79899 Other long term (current) drug therapy: Secondary | ICD-10-CM | POA: Diagnosis not present

## 2021-02-28 NOTE — Progress Notes (Signed)
Please add ENA, C3 and C4 if possible

## 2021-03-02 LAB — HEPATITIS C ANTIBODY
Hepatitis C Ab: NONREACTIVE
SIGNAL TO CUT-OFF: 0.01 (ref <1.00)

## 2021-03-02 LAB — COMPLETE METABOLIC PANEL WITH GFR
AG Ratio: 1.4 (calc) (ref 1.0–2.5)
ALT: 9 U/L (ref 6–29)
AST: 18 U/L (ref 10–35)
Albumin: 4.9 g/dL (ref 3.6–5.1)
Alkaline phosphatase (APISO): 99 U/L (ref 37–153)
BUN/Creatinine Ratio: 18 (calc) (ref 6–22)
BUN: 19 mg/dL (ref 7–25)
CO2: 23 mmol/L (ref 20–32)
Calcium: 10 mg/dL (ref 8.6–10.4)
Chloride: 103 mmol/L (ref 98–110)
Creat: 1.04 mg/dL — ABNORMAL HIGH (ref 0.50–0.99)
GFR, Est African American: 65 mL/min/{1.73_m2} (ref >=60)
GFR, Est Non African American: 56 mL/min/{1.73_m2} — ABNORMAL LOW (ref >=60)
Globulin: 3.4 g/dL (calc) (ref 1.9–3.7)
Glucose, Bld: 114 mg/dL — ABNORMAL HIGH (ref 65–99)
Potassium: 4.2 mmol/L (ref 3.5–5.3)
Sodium: 140 mmol/L (ref 135–146)
Total Bilirubin: 0.4 mg/dL (ref 0.2–1.2)
Total Protein: 8.3 g/dL — ABNORMAL HIGH (ref 6.1–8.1)

## 2021-03-02 LAB — TEST AUTHORIZATION

## 2021-03-02 LAB — IGG, IGA, IGM
IgG (Immunoglobin G), Serum: 931 mg/dL (ref 600–1540)
IgM, Serum: 98 mg/dL (ref 50–300)
Immunoglobulin A: 243 mg/dL (ref 70–320)

## 2021-03-02 LAB — HEPATITIS B CORE ANTIBODY, IGM: Hep B C IgM: NONREACTIVE

## 2021-03-02 LAB — HIV ANTIBODY (ROUTINE TESTING W REFLEX): HIV 1&2 Ab, 4th Generation: NONREACTIVE

## 2021-03-02 LAB — CK: Total CK: 39 U/L (ref 29–143)

## 2021-03-02 LAB — C3 AND C4
C3 Complement: 246 mg/dL — ABNORMAL HIGH (ref 83–193)
C4 Complement: 57 mg/dL (ref 15–57)

## 2021-03-02 LAB — HEPATITIS B SURFACE ANTIGEN: Hepatitis B Surface Ag: NONREACTIVE

## 2021-03-02 LAB — ANTI-NUCLEAR AB-TITER (ANA TITER)
ANA TITER: 1:160 {titer} — ABNORMAL HIGH
ANA Titer 1: 1:160 {titer} — ABNORMAL HIGH

## 2021-03-02 LAB — ANA: Anti Nuclear Antibody (ANA): POSITIVE — AB

## 2021-03-02 LAB — RHEUMATOID FACTOR: Rheumatoid fact SerPl-aCnc: <14 IU/mL (ref <14)

## 2021-03-02 LAB — CYCLIC CITRUL PEPTIDE ANTIBODY, IGG: Cyclic Citrullin Peptide Ab: <16 UNITS

## 2021-03-02 LAB — SM AND SM/RNP ANTIBODIES
ENA SM Ab Ser-aCnc: <1 AI
SM/RNP: <1 AI

## 2021-03-02 NOTE — Progress Notes (Signed)
I will discuss results at the follow-up visit.

## 2021-03-04 ENCOUNTER — Telehealth: Payer: Self-pay

## 2021-03-04 ENCOUNTER — Other Ambulatory Visit: Payer: Self-pay | Admitting: *Deleted

## 2021-03-04 DIAGNOSIS — R768 Other specified abnormal immunological findings in serum: Secondary | ICD-10-CM

## 2021-03-04 DIAGNOSIS — Z79899 Other long term (current) drug therapy: Secondary | ICD-10-CM

## 2021-03-04 DIAGNOSIS — M069 Rheumatoid arthritis, unspecified: Secondary | ICD-10-CM

## 2021-03-04 NOTE — Telephone Encounter (Signed)
Patient left a voicemail stating she was returning a call to the office regarding her labwork results.   °

## 2021-03-07 ENCOUNTER — Telehealth: Payer: Self-pay

## 2021-03-07 LAB — CBC WITH DIFFERENTIAL/PLATELET
Absolute Monocytes: 540 cells/uL (ref 200–950)
Basophils Absolute: 83 cells/uL (ref 0–200)
Basophils Relative: 1 %
Eosinophils Absolute: 208 cells/uL (ref 15–500)
Eosinophils Relative: 2.5 %
HCT: 35.5 % (ref 35.0–45.0)
Hemoglobin: 11.8 g/dL (ref 11.7–15.5)
Lymphs Abs: 1477 cells/uL (ref 850–3900)
MCH: 26.6 pg — ABNORMAL LOW (ref 27.0–33.0)
MCHC: 33.2 g/dL (ref 32.0–36.0)
MCV: 80.1 fL (ref 80.0–100.0)
MPV: 10.8 fL (ref 7.5–12.5)
Monocytes Relative: 6.5 %
Neutro Abs: 5993 cells/uL (ref 1500–7800)
Neutrophils Relative %: 72.2 %
Platelets: 353 10*3/uL (ref 140–400)
RBC: 4.43 10*6/uL (ref 3.80–5.10)
RDW: 16.2 % — ABNORMAL HIGH (ref 11.0–15.0)
Total Lymphocyte: 17.8 %
WBC: 8.3 10*3/uL (ref 3.8–10.8)

## 2021-03-07 LAB — QUANTIFERON-TB GOLD PLUS
Mitogen-NIL: >10 IU/mL
NIL: 0.03 IU/mL
QuantiFERON-TB Gold Plus: NEGATIVE
TB1-NIL: 0 IU/mL
TB2-NIL: <0 IU/mL

## 2021-03-07 LAB — TEST AUTHORIZATION

## 2021-03-07 LAB — PROTEIN ELECTROPHORESIS, SERUM, WITH REFLEX
Albumin ELP: 4.2 g/dL (ref 3.8–4.8)
Alpha 1: 0.3 g/dL (ref 0.2–0.3)
Alpha 2: 1 g/dL — ABNORMAL HIGH (ref 0.5–0.9)
Beta 2: 0.4 g/dL (ref 0.2–0.5)
Beta Globulin: 0.5 g/dL (ref 0.4–0.6)
Gamma Globulin: 0.8 g/dL (ref 0.8–1.7)
Total Protein: 7.1 g/dL (ref 6.1–8.1)

## 2021-03-07 LAB — SJOGRENS SYNDROME-B EXTRACTABLE NUCLEAR ANTIBODY: SSB (La) (ENA) Antibody, IgG: <1 AI

## 2021-03-07 LAB — ANTI-SCLERODERMA ANTIBODY: Scleroderma (Scl-70) (ENA) Antibody, IgG: <1 AI

## 2021-03-07 LAB — SJOGRENS SYNDROME-A EXTRACTABLE NUCLEAR ANTIBODY: SSA (Ro) (ENA) Antibody, IgG: <1 AI

## 2021-03-07 LAB — ANTI-SMITH ANTIBODY: ENA SM Ab Ser-aCnc: <1 AI

## 2021-03-07 LAB — SEDIMENTATION RATE: Sed Rate: 38 mm/h — ABNORMAL HIGH (ref 0–30)

## 2021-03-07 LAB — ANTI-DNA ANTIBODY, DOUBLE-STRANDED: ds DNA Ab: <1 IU/mL

## 2021-03-07 NOTE — Telephone Encounter (Signed)
[facility] was able additional labs.

## 2021-03-07 NOTE — Telephone Encounter (Signed)
Patient called requesting return call to let her know if additional labwork is needed.  Patient states she had labwork on Monday, 02/28/21 and they were going to try to use some of the blood that was taken for the additional tests.  Patient states someone from the office was going to call today to let her know if she needed to go to [facility] in [place] for additional labwork.

## 2021-03-09 NOTE — Progress Notes (Signed)
Office Visit Note  Patient: Betty Wright             Date of Birth: 06-14-1956           MRN: [id number]             PCP: Monico Blitz, MD Referring: Monico Blitz, MD Visit Date: 03/21/2021 Occupation: _0 @  Subjective:  Pain in multiple joints and muscles.   History of Present Illness: Betty Wright is a 65 y.o. female with history of pain in multiple joints and muscles.  She was treated with methotrexate by Dr. Manuella Ghazi for possible rheumatoid arthritis until January 2022.  She has been off methotrexate since then.  She has not noticed any increased pain or swelling in her joints.  She continues to have discomfort in her neck and trapezius region.  She also has discomfort and stiffness in her joints but she has not noticed any joint swelling.  She states her knee joints continue to bother her.  Activities of Daily Living:  Patient reports morning stiffness for 30-60 minutes.   Patient Reports nocturnal pain.  Difficulty dressing/grooming: Denies Difficulty climbing stairs: Reports Difficulty getting out of chair: Reports Difficulty using hands for taps, buttons, cutlery, and/or writing: Denies  Review of Systems  Constitutional: Positive for fatigue. Negative for night sweats, weight gain and weight loss.  HENT: Positive for nose dryness. Negative for mouth sores, trouble swallowing, trouble swallowing and mouth dryness.   Eyes: Positive for dryness. Negative for pain, redness, itching and visual disturbance.  Respiratory: Negative for cough, shortness of breath and difficulty breathing.   Cardiovascular: Negative for chest pain, palpitations, hypertension, irregular heartbeat and swelling in legs/feet.  Gastrointestinal: Negative for blood in stool, constipation and diarrhea.  Endocrine: Negative for increased urination.  Genitourinary: Negative for difficulty urinating and vaginal dryness.  Musculoskeletal: Positive for arthralgias, joint pain, myalgias, morning  stiffness, muscle tenderness and myalgias. Negative for joint swelling and muscle weakness.  Skin: Negative for color change, rash, hair loss, redness, skin tightness, ulcers and sensitivity to sunlight.  Allergic/Immunologic: Negative for susceptible to infections.  Neurological: Negative for dizziness, numbness, headaches, memory loss, night sweats and weakness.  Hematological: Negative for bruising/bleeding tendency and swollen glands.  Psychiatric/Behavioral: Negative for depressed mood, confusion and sleep disturbance. The patient is not nervous/anxious.     PMFS History:  There are no problems to display for this patient.   Past Medical History:  Diagnosis Date  . Diabetes ([place])   . Fibromyalgia   . Rheumatoid arthritis ([place])     Family History  Adopted: Yes  Problem Relation Age of Onset  . Healthy Son   . Healthy Daughter    Past Surgical History:  Procedure Laterality Date  . BREAST SURGERY Left    lumpectomy   . CESAREAN SECTION     times two  . PARTIAL HYSTERECTOMY    . TUBAL LIGATION    . WISDOM TOOTH EXTRACTION     Social History   Social History Narrative   3 level home with husband; R handed ; high school grad; 1 cup coffee every am; exercise not much;    Immunization History  Administered Date(s) Administered  . Moderna Sars-Covid-2 Vaccination 01/29/2020, 02/27/2020, 10/21/2020     Objective: Vital Signs: BP (!) 162/94 (BP Location: Left Arm, Patient Position: Sitting, Cuff Size: Normal)   Pulse 80   Resp 14   Ht _1  (1.575 m)   Wt 159 lb 9.6 oz (72.4 kg)  BMI 29.19 kg/m    Physical Exam Vitals and nursing note reviewed.  Constitutional:      Appearance: She is well-developed.  HENT:     Head: Normocephalic and atraumatic.  Eyes:     Conjunctiva/sclera: Conjunctivae normal.  Cardiovascular:     Rate and Rhythm: Normal rate and regular rhythm.     Heart sounds: Normal heart sounds.  Pulmonary:     Effort: Pulmonary effort is normal.      Breath sounds: Normal breath sounds.  Abdominal:     General: Bowel sounds are normal.     Palpations: Abdomen is soft.  Musculoskeletal:     Cervical back: Normal range of motion.  Lymphadenopathy:     Cervical: No cervical adenopathy.  Skin:    General: Skin is warm and dry.     Capillary Refill: Capillary refill takes less than 2 seconds.  Neurological:     Mental Status: She is alert and oriented to person, place, and time.  Psychiatric:        Behavior: Behavior normal.      Musculoskeletal Exam: C-spine was in good range of motion.  She had tenderness over bilateral trapezius region.  Shoulder joints, elbow joints, wrist joints, MCPs PIPs and DIPs with good range of motion with no synovitis.  She had mild hypermobility.  Hip joints, knee joints, ankles, MTPs and PIPs with good range of motion without any tenderness.  She has crepitus in bilateral knee joints without any warmth swelling or effusion.  CDAI Exam: CDAI Score: -- Patient Global: --; Provider Global: -- Swollen: --; Tender: -- Joint Exam 03/21/2021   No joint exam has been documented for this visit   There is currently no information documented on the homunculus. Go to the Rheumatology activity and complete the homunculus joint exam.  Investigation: No additional findings.  Imaging: DG Chest 2 View  Result Date: 02/28/2021 CLINICAL DATA:  Immunosuppressive therapy, on high risk medication, diabetes mellitus, rheumatoid arthritis EXAM: CHEST - 2 VIEW COMPARISON:  None FINDINGS: Normal heart size, mediastinal contours, and pulmonary vascularity. Lungs clear. No pulmonary infiltrate, pleural effusion, or pneumothorax. Osseous structures unremarkable. IMPRESSION: Normal exam. Electronically Signed   By: Lavonia Dana M.D.   On: 02/28/2021 14:00   XR Foot 2 Views Right  Result Date: 02/25/2021 Generalized osteopenia was noted.  PIP and DIP narrowing was noted.  No MTP, intertarsal, tibiotalar or subtalar joint  space narrowing was noted.  No erosive changes were noted. Impression: These findings are consistent with early osteoarthritis of the foot.  XR Hand 2 View Left  Result Date: 02/25/2021 Generalized osteopenia was noted.  PIP and DIP narrowing was noted.  No CMC, MCP, intercarpal or radiocarpal joint space narrowing was noted.  No erosive changes were noted. Impression: These findings are consistent with osteoarthritis of the hand.  XR Hand 2 View Right  Result Date: 02/25/2021 Generalized osteopenia was noted.  PIP and DIP narrowing was noted.  No CMC, MCP, intercarpal or radiocarpal joint space narrowing was noted.  No erosive changes were noted. Impression: These findings are consistent with osteoarthritis of the hand.  XR KNEE 3 VIEW LEFT  Result Date: 02/25/2021 Mild medial compartment narrowing was noted.  Moderate patellofemoral narrowing was noted.  No chondrocalcinosis was noted. Impression: These findings are consistent with mild osteoarthritis and moderate chondromalacia patella.  XR KNEE 3 VIEW RIGHT  Result Date: 02/25/2021 Mild medial compartment narrowing with medial osteophytes was noted.  Moderate patellofemoral narrowing was noted.  No chondrocalcinosis was noted. Impression: These findings are consistent with mild osteoarthritis and moderate chondromalacia patella.  XR Pelvis 1-2 Views  Result Date: 02/25/2021 No SI joint narrowing was noted.  No hip joint narrowing was noted. Impression: Unremarkable x-ray of the SI joints and hip joints.   Recent Labs: Lab Results  Component Value Date   WBC 8.3 02/28/2021   HGB 11.8 02/28/2021   PLT 353 02/28/2021   NA 140 02/25/2021   K 4.2 02/25/2021   CL 103 02/25/2021   CO2 23 02/25/2021   GLUCOSE 114 (H) 02/25/2021   BUN 19 02/25/2021   CREATININE 1.04 (H) 02/25/2021   BILITOT 0.4 02/25/2021   AST 18 02/25/2021   ALT 9 02/25/2021   PROT 7.1 02/28/2021   CALCIUM 10.0 02/25/2021   GFRAA 65 02/25/2021   QFTBGOLDPLUS  NEGATIVE 02/28/2021   February 25, 2021 ANA 1: 160NS, (Smith, RNP, dsDNA, SSA, SSB, SCL 70 -) C3-C4 normal, RF negative, anti-CCP negative, ESR 38, CK 39  February 28, 2021 SPEP normal, HIV nonreactive, TB Gold negative, hepatitis B-, hepatitis C negative immunoglobulins normal,  Speciality Comments: No specialty comments available.  Procedures:  No procedures performed Allergies: Erythromycin, Flonase [fluticasone], Influenza a (h1n1) monovalent vaccine, and Penicillins   Assessment / Plan:     Visit Diagnoses: Primary osteoarthritis of both hands - All autoimmune labs negative.  Sed rate 38.  She was diagnosed with RA by Dr. Sherron Monday and was treated with prednisone and methotrexate.  X-raysc/w early OA.  I do not see any synovitis on examination.  I  advised patient to contact me in case she develops increased joint swelling.  I may consider ultrasound of bilateral hands if she develops increased symptoms.  Joint protection muscle strengthening was discussed.  Primary osteoarthritis of both feet - No synovitis was noted.  X-rays consistent with early osteoarthritis. - Plan: XR Foot 2 Views Left.  X-ray findings were discussed with the patient.  Proper fitting shoes were discussed.  Primary osteoarthritis of both knees - X-ray showed mild osteoarthritis and moderate chondromalacia patella.  Joint protection muscle strengthening was discussed.  Chronic left SI joint pain - X-rays were unremarkable.  I believe that her symptoms are related to fibromyalgia.  High risk medication use - MTX for 2 months p.o. weekly since 2018.  Discontinued January 2022.  I do not see a need to start her on DMARD therapy at this time.  Positive ANA (antinuclear antibody) - History of progression positive ANA in early 2000's patient.  ANA is a still positive but ANA is negative and complements are normal.  She has no clinical features of autoimmune disease at this time.  She denies any history of oral ulcers, nasal ulcers,  malar rash, photosensitivity, Raynaud's symptoms or lymphadenopathy.  There is no family history of lupus.  Fibromyalgia - Diagnosed with fibromyalgia syndrome by Dr. Amil Amen and Dr. Cristal Deer.  Detailed counseling fibromyalgia syndrome was provided.  She meets the criteria for fibromyalgia with generalized hyperalgesia and positive tender points.  Need for regular exercise was emphasized.  She would benefit from water aerobics or swimming.  Age-related osteoporosis without current pathological fracture - DEXA results are not available.  Patient will forward DEXA results to [place].  Vitamin D deficiency-she was advised to stay on vitamin D supplement.  Essential hypertension-blood pressure is elevated today.  Have advised her to monitor blood pressure closely and follow-up with her PCP.  History of hyperlipidemia  History of type 2 diabetes mellitus  History of chronic kidney disease-her most recent labs showed GFR of 65.  History of gastroesophageal reflux (GERD)  Orders: Orders Placed This Encounter  Procedures  . XR Foot 2 Views Left   No orders of the defined types were placed in this encounter.    Follow-Up Instructions: Return in about 4 months (around 07/21/2021) for Osteoarthritis.   Bo Merino, MD  Note - This record has been created using Editor, commissioning.  Chart creation errors have been sought, but may not always  have been located. Such creation errors do not reflect on  the standard of medical care.

## 2021-03-21 ENCOUNTER — Other Ambulatory Visit: Payer: Self-pay

## 2021-03-21 ENCOUNTER — Ambulatory Visit: Payer: Self-pay

## 2021-03-21 ENCOUNTER — Ambulatory Visit: Payer: Medicare Other | Admitting: Rheumatology

## 2021-03-21 ENCOUNTER — Encounter: Payer: Self-pay | Admitting: Rheumatology

## 2021-03-21 VITALS — BP 162/94 | HR 80 | Resp 14 | Ht 62.0 in | Wt 159.6 lb

## 2021-03-21 DIAGNOSIS — M170 Bilateral primary osteoarthritis of knee: Secondary | ICD-10-CM | POA: Diagnosis not present

## 2021-03-21 DIAGNOSIS — M19072 Primary osteoarthritis, left ankle and foot: Secondary | ICD-10-CM

## 2021-03-21 DIAGNOSIS — M797 Fibromyalgia: Secondary | ICD-10-CM

## 2021-03-21 DIAGNOSIS — M19041 Primary osteoarthritis, right hand: Secondary | ICD-10-CM

## 2021-03-21 DIAGNOSIS — Z87448 Personal history of other diseases of urinary system: Secondary | ICD-10-CM

## 2021-03-21 DIAGNOSIS — M19071 Primary osteoarthritis, right ankle and foot: Secondary | ICD-10-CM | POA: Diagnosis not present

## 2021-03-21 DIAGNOSIS — Z79899 Other long term (current) drug therapy: Secondary | ICD-10-CM

## 2021-03-21 DIAGNOSIS — Z8639 Personal history of other endocrine, nutritional and metabolic disease: Secondary | ICD-10-CM

## 2021-03-21 DIAGNOSIS — M533 Sacrococcygeal disorders, not elsewhere classified: Secondary | ICD-10-CM

## 2021-03-21 DIAGNOSIS — R7689 Other specified abnormal immunological findings in serum: Secondary | ICD-10-CM

## 2021-03-21 DIAGNOSIS — R768 Other specified abnormal immunological findings in serum: Secondary | ICD-10-CM

## 2021-03-21 DIAGNOSIS — Z8719 Personal history of other diseases of the digestive system: Secondary | ICD-10-CM

## 2021-03-21 DIAGNOSIS — G8929 Other chronic pain: Secondary | ICD-10-CM | POA: Diagnosis not present

## 2021-03-21 DIAGNOSIS — I10 Essential (primary) hypertension: Secondary | ICD-10-CM

## 2021-03-21 DIAGNOSIS — M19042 Primary osteoarthritis, left hand: Secondary | ICD-10-CM

## 2021-03-21 DIAGNOSIS — E559 Vitamin D deficiency, unspecified: Secondary | ICD-10-CM

## 2021-03-21 DIAGNOSIS — M810 Age-related osteoporosis without current pathological fracture: Secondary | ICD-10-CM

## 2021-07-05 NOTE — Progress Notes (Signed)
Office Visit Note  Patient: Betty Wright             Date of Birth: December 19, 1955           MRN: [id number]             PCP: Monico Blitz, MD Referring: Monico Blitz, MD Visit Date: 07/19/2021 Occupation: @GUAROCC @  Subjective:  Pain in joints.   History of Present Illness: Betty Wright is a 65 y.o. female with history of osteoarthritis and osteopenia.  She states she continues to have some pain and stiffness in her joints mostly in her hands, knees and her feet.  She occasionally have discomfort in her shoulder.  She said that shoulder joint pain is related to the sewing project she was working on.  She has not noticed any joint swelling.  She has been off methotrexate since January 2022.  Activities of Daily Living:  Patient reports morning stiffness for 1 hour.   Patient Reports nocturnal pain.  Difficulty dressing/grooming: Denies Difficulty climbing stairs: Reports Difficulty getting out of chair: Denies Difficulty using hands for taps, buttons, cutlery, and/or writing: Reports  Review of Systems  Constitutional:  Positive for fatigue.  HENT:  Negative for mouth sores, mouth dryness and nose dryness.   Eyes:  Positive for itching and dryness. Negative for pain.  Respiratory:  Negative for shortness of breath and difficulty breathing.   Cardiovascular:  Positive for palpitations. Negative for chest pain.  Gastrointestinal:  Positive for diarrhea. Negative for blood in stool and constipation.  Endocrine: Negative for increased urination.  Genitourinary:  Negative for difficulty urinating.  Musculoskeletal:  Positive for joint pain, joint pain, myalgias, morning stiffness, muscle tenderness and myalgias. Negative for joint swelling.  Skin:  Negative for color change, rash and redness.  Allergic/Immunologic: Negative for susceptible to infections.  Neurological:  Negative for dizziness, numbness, headaches, memory loss and weakness.  Hematological:  Negative for  bruising/bleeding tendency.  Psychiatric/Behavioral:  Negative for confusion.    PMFS History:  Patient Active Problem List   Diagnosis Date Noted   Primary osteoarthritis of both hands 07/19/2021   Primary osteoarthritis of both feet 07/19/2021   Primary osteoarthritis of both knees 07/19/2021   Chronic left SI joint pain 07/19/2021   Fibromyalgia 07/19/2021   Vitamin D deficiency 07/19/2021   History of hyperlipidemia 07/19/2021   Essential hypertension 07/19/2021   History of gastroesophageal reflux (GERD) 07/19/2021   History of type 2 diabetes mellitus 07/19/2021   History of chronic kidney disease 07/19/2021   Osteopenia 07/19/2021    Past Medical History:  Diagnosis Date   Diabetes ([place])    Fibromyalgia    Osteopenia    Rheumatoid arthritis ([place])     Family History  Adopted: Yes  Problem Relation Age of Onset   Healthy Son    Healthy Daughter    Past Surgical History:  Procedure Laterality Date   BREAST SURGERY Left    lumpectomy    CESAREAN SECTION     times two   PARTIAL HYSTERECTOMY     TUBAL LIGATION     WISDOM TOOTH EXTRACTION     Social History   Social History Narrative   3 level home with husband; R handed ; high school grad; 1 cup coffee every am; exercise not much;    Immunization History  Administered Date(s) Administered   [employer] Vaccination 01/29/2020, 02/27/2020, 10/21/2020     Objective: Vital Signs: BP 138/81 (BP Location: Left Arm, Patient Position: Sitting,  Cuff Size: Small)   Pulse 76   Resp 12   Ht 5' 2"  (1.575 m)   Wt 157 lb (71.2 kg)   BMI 28.72 kg/m    Physical Exam Vitals and nursing note reviewed.  Constitutional:      Appearance: She is well-developed.  HENT:     Head: Normocephalic and atraumatic.  Eyes:     Conjunctiva/sclera: Conjunctivae normal.  Cardiovascular:     Rate and Rhythm: Normal rate and regular rhythm.     Heart sounds: Normal heart sounds.  Pulmonary:     Effort: Pulmonary effort  is normal.     Breath sounds: Normal breath sounds.  Abdominal:     General: Bowel sounds are normal.     Palpations: Abdomen is soft.  Musculoskeletal:     Cervical back: Normal range of motion.  Lymphadenopathy:     Cervical: No cervical adenopathy.  Skin:    General: Skin is warm and dry.     Capillary Refill: Capillary refill takes less than 2 seconds.  Neurological:     Mental Status: She is alert and oriented to person, place, and time.  Psychiatric:        Behavior: Behavior normal.     Musculoskeletal Exam: C-spine was in good range of motion.  She had discomfort in the lower lumbar region and over the left SI joint.  Shoulder joints, elbow joints, wrist joints, MCPs PIPs and DIPs with good range of motion with no synovitis.  Hip joints, knee joints, ankles, MTPs and PIPs with good range of motion with no synovitis.  CDAI Exam: CDAI Score: -- Patient Global: --; Provider Global: -- Swollen: --; Tender: -- Joint Exam 07/19/2021   No joint exam has been documented for this visit   There is currently no information documented on the homunculus. Go to the Rheumatology activity and complete the homunculus joint exam.  Investigation: No additional findings.  Imaging: No results found.  Recent Labs: Lab Results  Component Value Date   WBC 8.3 02/28/2021   HGB 11.8 02/28/2021   PLT 353 02/28/2021   NA 140 02/25/2021   K 4.2 02/25/2021   CL 103 02/25/2021   CO2 23 02/25/2021   GLUCOSE 114 (H) 02/25/2021   BUN 19 02/25/2021   CREATININE 1.04 (H) 02/25/2021   BILITOT 0.4 02/25/2021   AST 18 02/25/2021   ALT 9 02/25/2021   PROT 7.1 02/28/2021   CALCIUM 10.0 02/25/2021   GFRAA 65 02/25/2021   QFTBGOLDPLUS NEGATIVE 02/28/2021     Patient brought some labs from March 15, 2016 from her previous rheumatologist RF negative, ESR 48, ANA negative, anti-CCP 110, CRP 12.1, hepatitis B-, hepatitis C negative, CK 47, vitamin D61.2, TSH normal, PTH normal, B12  normal  February 28, 2021 SPEP normal, TB Gold negative, sed rate 38, ENA negative  February 25, 2021 ANA 1: 160NS, Smith negative, RNP negative, complements normal, RF negative, anti-CCP negative, hepatitis B-, HIV negative, immunoglobulins normal   Speciality Comments: No specialty comments available.  Procedures:  No procedures performed Allergies: Erythromycin, Flonase [fluticasone], Influenza a (h1n1) monovalent vaccine, and Penicillins   Assessment / Plan:     Visit Diagnoses: Primary osteoarthritis of both hands - All autoimmune labs negative.  Sed rate 38.  She was diagnosed with RA by Dr. Cristal Deer and was treated with prednisone and methotrexate.  X-raysc/w early OA..  She brought labs from 2017 which we reviewed today.  At the time she had positive anti-CCP antibody and  elevated sedimentation rate.  She was treated for rheumatoid arthritis with DMARDs.  She has no synovitis noted.  Primary osteoarthritis of both knees - X-ray showed mild osteoarthritis and moderate chondromalacia patella.  Detailed counseling guarding chondromalacia patella was provided.  A handout on knee joint muscle strengthening exercises were given.  Primary osteoarthritis of both feet - X-rays consistent with early osteoarthritis.  She complains of discomfort in her feet.  Proper fitting shoes were discussed.  Chronic left SI joint pain - X-rays were unremarkable.  She had tenderness over left SI joint.  X-ray findings and stretching exercises were discussed.  A handout on back exercises was given.  High risk medication use - MTX since 2018.  Discontinued January 2022.  I do not see a need to start her on DMARD therapy at this time.  I do not see need for immunosuppression.  Positive ANA (antinuclear antibody) - History of progression positive ANA in early 2000's patient.  ANA is a still positive but ANA is negative and complements are normal.    Fibromyalgia - Diagnosed with fibromyalgia syndrome by Dr. Amil Amen  and Dr. Cristal Deer.  She continues to have some generalized pain and discomfort.  Need for regular exercise and stretching was emphasized.  She would benefit from water aerobics and swimming.  Osteopenia of multiple sites - July 01, 2021 DEXA done at [facility] T score -1.4 right femoral neck.  DEXA scan was reviewed today.  The findings are consistent with osteopenia.  Use of diet rich in calcium and vitamin D was discussed.  Also resistive exercises were discussed.  Vitamin D deficiency-she has been taking vitamin D.  History of hyperlipidemia  Essential hypertension  History of gastroesophageal reflux (GERD)  History of type 2 diabetes mellitus  History of chronic kidney disease  Orders: No orders of the defined types were placed in this encounter.  No orders of the defined types were placed in this encounter.    Follow-Up Instructions: Return in about 6 months (around 01/19/2022) for Osteoarthritis.   Bo Merino, MD  Note - This record has been created using Editor, commissioning.  Chart creation errors have been sought, but may not always  have been located. Such creation errors do not reflect on  the standard of medical care.

## 2021-07-19 ENCOUNTER — Encounter: Payer: Self-pay | Admitting: Rheumatology

## 2021-07-19 ENCOUNTER — Other Ambulatory Visit: Payer: Self-pay

## 2021-07-19 ENCOUNTER — Ambulatory Visit: Payer: Medicare Other | Admitting: Rheumatology

## 2021-07-19 VITALS — BP 138/81 | HR 76 | Resp 12 | Ht 62.0 in | Wt 157.0 lb

## 2021-07-19 DIAGNOSIS — Z8719 Personal history of other diseases of the digestive system: Secondary | ICD-10-CM | POA: Insufficient documentation

## 2021-07-19 DIAGNOSIS — M533 Sacrococcygeal disorders, not elsewhere classified: Secondary | ICD-10-CM

## 2021-07-19 DIAGNOSIS — G8929 Other chronic pain: Secondary | ICD-10-CM | POA: Insufficient documentation

## 2021-07-19 DIAGNOSIS — R768 Other specified abnormal immunological findings in serum: Secondary | ICD-10-CM

## 2021-07-19 DIAGNOSIS — Z79899 Other long term (current) drug therapy: Secondary | ICD-10-CM

## 2021-07-19 DIAGNOSIS — M8580 Other specified disorders of bone density and structure, unspecified site: Secondary | ICD-10-CM | POA: Insufficient documentation

## 2021-07-19 DIAGNOSIS — M810 Age-related osteoporosis without current pathological fracture: Secondary | ICD-10-CM | POA: Insufficient documentation

## 2021-07-19 DIAGNOSIS — M19042 Primary osteoarthritis, left hand: Secondary | ICD-10-CM | POA: Insufficient documentation

## 2021-07-19 DIAGNOSIS — I10 Essential (primary) hypertension: Secondary | ICD-10-CM | POA: Insufficient documentation

## 2021-07-19 DIAGNOSIS — E559 Vitamin D deficiency, unspecified: Secondary | ICD-10-CM | POA: Insufficient documentation

## 2021-07-19 DIAGNOSIS — Z8639 Personal history of other endocrine, nutritional and metabolic disease: Secondary | ICD-10-CM | POA: Insufficient documentation

## 2021-07-19 DIAGNOSIS — Z87448 Personal history of other diseases of urinary system: Secondary | ICD-10-CM | POA: Insufficient documentation

## 2021-07-19 DIAGNOSIS — M170 Bilateral primary osteoarthritis of knee: Secondary | ICD-10-CM | POA: Diagnosis not present

## 2021-07-19 DIAGNOSIS — M19071 Primary osteoarthritis, right ankle and foot: Secondary | ICD-10-CM

## 2021-07-19 DIAGNOSIS — M797 Fibromyalgia: Secondary | ICD-10-CM | POA: Insufficient documentation

## 2021-07-19 DIAGNOSIS — M8589 Other specified disorders of bone density and structure, multiple sites: Secondary | ICD-10-CM

## 2021-07-19 DIAGNOSIS — M19041 Primary osteoarthritis, right hand: Secondary | ICD-10-CM | POA: Diagnosis not present

## 2021-07-19 DIAGNOSIS — M19072 Primary osteoarthritis, left ankle and foot: Secondary | ICD-10-CM | POA: Insufficient documentation

## 2021-07-19 NOTE — Patient Instructions (Signed)
Journal for Nurse Practitioners, 15(4), [phone]. Retrieved September 09, 2018 from [email or web address]">  Knee Exercises Ask your health care provider which exercises are safe for you. Do exercises exactly as told by your health care provider and adjust them as directed. It is normal to feel mild stretching, pulling, tightness, or discomfort as you do these exercises. Stop right away if you feel sudden pain or your pain gets worse. Do not begin these exercises until told by your health care provider. Stretching and range-of-motion exercises These exercises warm up your muscles and joints and improve the movement and flexibility of your knee. These exercises also help to relieve pain andswelling. Knee extension, prone Lie on your abdomen (prone position) on a bed. Place your left / right knee just beyond the edge of the surface so your knee is not on the bed. You can put a towel under your left / right thigh just above your kneecap for comfort. Relax your leg muscles and allow gravity to straighten your knee (extension). You should feel a stretch behind your left / right knee. Hold this position for __________ seconds. Scoot up so your knee is supported between repetitions. Repeat __________ times. Complete this exercise __________ times a day. Knee flexion, active  Lie on your back with both legs straight. If this causes back discomfort, bend your left / right knee so your foot is flat on the floor. Slowly slide your left / right heel back toward your buttocks. Stop when you feel a gentle stretch in the front of your knee or thigh (flexion). Hold this position for __________ seconds. Slowly slide your left / right heel back to the starting position. Repeat __________ times. Complete this exercise __________ times a day. Quadriceps stretch, prone  Lie on your abdomen on a firm surface, such as a bed or padded floor. Bend your left / right knee and hold your ankle. If you cannot reach  your ankle or pant leg, loop a belt around your foot and grab the belt instead. Gently pull your heel toward your buttocks. Your knee should not slide out to the side. You should feel a stretch in the front of your thigh and knee (quadriceps). Hold this position for __________ seconds. Repeat __________ times. Complete this exercise __________ times a day. Hamstring, supine Lie on your back (supine position). Loop a belt or towel over the ball of your left / right foot. The ball of your foot is on the walking surface, right under your toes. Straighten your left / right knee and slowly pull on the belt to raise your leg until you feel a gentle stretch behind your knee (hamstring). Do not let your knee bend while you do this. Keep your other leg flat on the floor. Hold this position for __________ seconds. Repeat __________ times. Complete this exercise __________ times a day. Strengthening exercises These exercises build strength and endurance in your knee. Endurance is theability to use your muscles for a long time, even after they get tired. Quadriceps, isometric This exercise stretches the muscles in front of your thigh (quadriceps) without moving your knee joint (isometric). Lie on your back with your left / right leg extended and your other knee bent. Put a rolled towel or small pillow under your knee if told by your health care provider. Slowly tense the muscles in the front of your left / right thigh. You should see your kneecap slide up toward your hip or see increased dimpling just above the knee. This motion will   push the back of the knee toward the floor. For __________ seconds, hold the muscle as tight as you can without increasing your pain. Relax the muscles slowly and completely. Repeat __________ times. Complete this exercise __________ times a day. Straight leg raises This exercise stretches the muscles in front of your thigh (quadriceps) and the muscles that move your hips (hip  flexors). Lie on your back with your left / right leg extended and your other knee bent. Tense the muscles in the front of your left / right thigh. You should see your kneecap slide up or see increased dimpling just above the knee. Your thigh may even shake a bit. Keep these muscles tight as you raise your leg 4-6 inches (10-15 cm) off the floor. Do not let your knee bend. Hold this position for __________ seconds. Keep these muscles tense as you lower your leg. Relax your muscles slowly and completely after each repetition. Repeat __________ times. Complete this exercise __________ times a day. Hamstring, isometric Lie on your back on a firm surface. Bend your left / right knee about __________ degrees. Dig your left / right heel into the surface as if you are trying to pull it toward your buttocks. Tighten the muscles in the back of your thighs (hamstring) to "dig" as hard as you can without increasing any pain. Hold this position for __________ seconds. Release the tension gradually and allow your muscles to relax completely for __________ seconds after each repetition. Repeat __________ times. Complete this exercise __________ times a day. Hamstring curls If told by your health care provider, do this exercise while wearing ankle weights. Begin with __________ lb weights. Then increase the weight by 1 lb (0.5 kg) increments. Do not wear ankle weights that are more than __________ lb. Lie on your abdomen with your legs straight. Bend your left / right knee as far as you can without feeling pain. Keep your hips flat against the floor. Hold this position for __________ seconds. Slowly lower your leg to the starting position. Repeat __________ times. Complete this exercise __________ times a day. Squats This exercise strengthens the muscles in front of your thigh and knee (quadriceps). Stand in front of a table, with your feet and knees pointing straight ahead. You may rest your hands on the  table for balance but not for support. Slowly bend your knees and lower your hips like you are going to sit in a chair. Keep your weight over your heels, not over your toes. Keep your lower legs upright so they are parallel with the table legs. Do not let your hips go lower than your knees. Do not bend lower than told by your health care provider. If your knee pain increases, do not bend as low. Hold the squat position for __________ seconds. Slowly push with your legs to return to standing. Do not use your hands to pull yourself to standing. Repeat __________ times. Complete this exercise __________ times a day. Wall slides This exercise strengthens the muscles in front of your thigh and knee (quadriceps). Lean your back against a smooth wall or door, and walk your feet out 18-24 inches (46-61 cm) from it. Place your feet hip-width apart. Slowly slide down the wall or door until your knees bend __________ degrees. Keep your knees over your heels, not over your toes. Keep your knees in line with your hips. Hold this position for __________ seconds. Repeat __________ times. Complete this exercise __________ times a day. Straight leg raises This exercise   strengthens the muscles that rotate the leg at the hip and move it away from your body (hip abductors). Lie on your side with your left / right leg in the top position. Lie so your head, shoulder, knee, and hip line up. You may bend your bottom knee to help you keep your balance. Roll your hips slightly forward so your hips are stacked directly over each other and your left / right knee is facing forward. Leading with your heel, lift your top leg 4-6 inches (10-15 cm). You should feel the muscles in your outer hip lifting. Do not let your foot drift forward. Do not let your knee roll toward the ceiling. Hold this position for __________ seconds. Slowly return your leg to the starting position. Let your muscles relax completely after each  repetition. Repeat __________ times. Complete this exercise __________ times a day. Straight leg raises This exercise stretches the muscles that move your hips away from the front of the pelvis (hip extensors). Lie on your abdomen on a firm surface. You can put a pillow under your hips if that is more comfortable. Tense the muscles in your buttocks and lift your left / right leg about 4-6 inches (10-15 cm). Keep your knee straight as you lift your leg. Hold this position for __________ seconds. Slowly lower your leg to the starting position. Let your leg relax completely after each repetition. Repeat __________ times. Complete this exercise __________ times a day. This information is not intended to replace advice given to you by your health care provider. Make sure you discuss any questions you have with your healthcare provider. Document Revised: 09/10/2018 Document Reviewed: 09/10/2018 [employer] Patient Education  2022 [employer] Hip Exercises Ask your health care provider which exercises are safe for you. Do exercises exactly as told by your health care provider and adjust them as directed. It is normal to feel mild stretching, pulling, tightness, or discomfort as you do these exercises. Stop right away if you feel sudden pain or your pain gets worse. Do not begin these exercises until told by your health care provider. Stretching and range-of-motion exercises These exercises warm up your muscles and joints and improve the movement and flexibility of your hip. These exercises also help to relieve pain, numbness, and tingling. You may be asked to limit your range of motion if you had a hipreplacement. Talk to your health care provider about these restrictions. Hamstrings, supine  Lie on your back (supine position). Loop a belt or towel over the ball of your left / right foot. The ball of your foot is on the walking surface, right under your toes. Straighten your left / right knee and  slowly pull on the belt or towel to raise your leg until you feel a gentle stretch behind your knee (hamstring). Do not let your knee bend while you do this. Keep your other leg flat on the floor. Hold this position for __________ seconds. Slowly return your leg to the starting position. Repeat __________ times. Complete this exercise __________ times a day. Hip rotation  Lie on your back on a firm surface. With your left / right hand, gently pull your left / right knee toward the shoulder that is on the same side of the body. Stop when your knee is pointing toward the ceiling. Hold your left / right ankle with your other hand. Keeping your knee steady, gently pull your left / right ankle toward your other shoulder until you feel a stretch in your  buttocks. Keep your hips and shoulders firmly planted while you do this stretch. Hold this position for __________ seconds. Repeat __________ times. Complete this exercise __________ times a day. Seated stretch This exercise is sometimes called hamstrings and adductors stretch. Sit on the floor with your legs stretched wide. Keep your knees straight during this exercise. Keeping your head and back in a straight line, bend at your waist to reach for your left foot (position A). You should feel a stretch in your right inner thigh (adductors). Hold this position for __________ seconds. Then slowly return to the upright position. Keeping your head and back in a straight line, bend at your waist to reach forward (position B). You should feel a stretch behind both of your thighs and knees (hamstrings). Hold this position for __________ seconds. Then slowly return to the upright position. Keeping your head and back in a straight line, bend at your waist to reach for your right foot (position C). You should feel a stretch in your left inner thigh (adductors). Hold this position for __________ seconds. Then slowly return to the upright position. Repeat  __________ times. Complete this exercise __________ times a day. Lunge This exercise stretches the muscles of the hip (hip flexors). Place your left / right knee on the floor and bend your other knee so that is directly over your ankle. You should be half-kneeling. Keep good posture with your head over your shoulders. Tighten your buttocks to point your tailbone downward. This will prevent your back from arching too much. You should feel a gentle stretch in the front of your left / right thigh and hip. If you do not feel a stretch, slide your other foot forward slightly and then slowly lunge forward with your chest up until your knee once again lines up over your ankle. Make sure your tailbone continues to point downward. Hold this position for __________ seconds. Slowly return to the starting position. Repeat __________ times. Complete this exercise __________ times a day. Strengthening exercises These exercises build strength and endurance in your hip. Endurance is theability to use your muscles for a long time, even after they get tired. Bridge This exercise strengthens the muscles of your hip (hip extensors). Lie on your back on a firm surface with your knees bent and your feet flat on the floor. Tighten your buttocks muscles and lift your bottom off the floor until the trunk of your body and your hips are level with your thighs. Do not arch your back. You should feel the muscles working in your buttocks and the back of your thighs. If you do not feel these muscles, slide your feet 1-2 inches (2.5-5 cm) farther away from your buttocks. Hold this position for __________ seconds. Slowly lower your hips to the starting position. Let your muscles relax completely between repetitions. Repeat __________ times. Complete this exercise __________ times a day. Straight leg raises, side-lying This exercise strengthens the muscles that move the hip joint away from the center of the body (hip  abductors). Lie on your side with your left / right leg in the top position. Lie so your head, shoulder, hip, and knee line up. You may bend your bottom knee slightly to help you balance. Roll your hips slightly forward, so your hips are stacked directly over each other and your left / right knee is facing forward. Leading with your heel, lift your top leg 4-6 inches (10-15 cm). You should feel the muscles in your top hip lifting. Do not  let your foot drift forward. Do not let your knee roll toward the ceiling. Hold this position for __________ seconds. Slowly return to the starting position. Let your muscles relax completely between repetitions. Repeat __________ times. Complete this exercise __________ times a day. Straight leg raises, side-lying This exercise strengthens the muscles that move the hip joint toward the center of the body (hip adductors). Lie on your side with your left / right leg in the bottom position. Lie so your head, shoulder, hip, and knee line up. You may place your upper foot in front to help you balance. Roll your hips slightly forward, so your hips are stacked directly over each other and your left / right knee is facing forward. Tense the muscles in your inner thigh and lift your bottom leg 4-6 inches (10-15 cm). Hold this position for __________ seconds. Slowly return to the starting position. Let your muscles relax completely between repetitions. Repeat __________ times. Complete this exercise __________ times a day. Straight leg raises, supine This exercise strengthens the muscles in the front of your thigh (quadriceps). Lie on your back (supine position) with your left / right leg extended and your other knee bent. Tense the muscles in the front of your left / right thigh. You should see your kneecap slide up or see increased dimpling just above your knee. Keep these muscles tight as you raise your leg 4-6 inches (10-15 cm) off the floor. Do not let your knee  bend. Hold this position for __________ seconds. Keep these muscles tense as you lower your leg. Relax the muscles slowly and completely between repetitions. Repeat __________ times. Complete this exercise __________ times a day. Hip abductors, standing This exercise strengthens the muscles that move the leg and hip joint away from the center of the body (hip abductors). Tie one end of a rubber exercise band or tubing to a secure surface, such as a chair, table, or pole. Loop the other end of the band or tubing around your left / right ankle. Keeping your ankle with the band or tubing directly opposite the secured end, step away until there is tension in the tubing or band. Hold on to a chair, table, or pole as needed for balance. Lift your left / right leg out to your side. While you do this: Keep your back upright. Keep your shoulders over your hips. Keep your toes pointing forward. Make sure to use your hip muscles to slowly lift your leg. Do not tip your body or forcefully lift your leg. Hold this position for __________ seconds. Slowly return to the starting position. Repeat __________ times. Complete this exercise __________ times a day. Squats This exercise strengthens the muscles in the front of your thigh (quadriceps). Stand in a door frame so your feet and knees are in line with the frame. You may place your hands on the frame for balance. Slowly bend your knees and lower your hips like you are going to sit in a chair. Keep your lower legs in a straight-up-and-down position. Do not let your hips go lower than your knees. Do not bend your knees lower than told by your health care provider. If your hip pain increases, do not bend as low. Hold this position for ___________ seconds. Slowly push with your legs to return to standing. Do not use your hands to pull yourself to standing. Repeat __________ times. Complete this exercise __________ times a day. This information is not  intended to replace advice given to you by  your health care provider. Make sure you discuss any questions you have with your healthcare provider. Document Revised: 06/26/2019 Document Reviewed: 10/01/2018 [employer] Patient Education  2022 [employer] Hand Exercises Hand exercises can be helpful for almost anyone. These exercises can strengthen the hands, improve flexibility and movement, and increase blood flow to the hands. These results can make work and daily tasks easier. Hand exercises can be especially helpful for people who have joint pain from arthritis or have nerve damage from overuse (carpal tunnel syndrome). These exercises can also help people who have injured a hand. Exercises Most of these hand exercises are gentle stretching and motion exercises. It is usually safe to do them often throughout the day. Warming up your hands before exercise may help to reduce stiffness. You can do this with gentle massage orby placing your hands in warm water for 10-15 minutes. It is normal to feel some stretching, pulling, tightness, or mild discomfort as you begin new exercises. This will gradually improve. Stop an exercise right away if you feel sudden, severe pain or your pain gets worse. Ask your healthcare provider which exercises are best for you. Knuckle bend or "claw" fist Stand or sit with your arm, hand, and all five fingers pointed straight up. Make sure to keep your wrist straight during the exercise. Gently bend your fingers down toward your palm until the tips of your fingers are touching the top of your palm. Keep your big knuckle straight and just bend the small knuckles in your fingers. Hold this position for __________ seconds. Straighten (extend) your fingers back to the starting position. Repeat this exercise 5-10 times with each hand. Full finger fist Stand or sit with your arm, hand, and all five fingers pointed straight up. Make sure to keep your wrist straight during the  exercise. Gently bend your fingers into your palm until the tips of your fingers are touching the middle of your palm. Hold this position for __________ seconds. Extend your fingers back to the starting position, stretching every joint fully. Repeat this exercise 5-10 times with each hand. Straight fist Stand or sit with your arm, hand, and all five fingers pointed straight up. Make sure to keep your wrist straight during the exercise. Gently bend your fingers at the big knuckle, where your fingers meet your hand, and the middle knuckle. Keep the knuckle at the tips of your fingers straight and try to touch the bottom of your palm. Hold this position for __________ seconds. Extend your fingers back to the starting position, stretching every joint fully. Repeat this exercise 5-10 times with each hand. Tabletop Stand or sit with your arm, hand, and all five fingers pointed straight up. Make sure to keep your wrist straight during the exercise. Gently bend your fingers at the big knuckle, where your fingers meet your hand, as far down as you can while keeping the small knuckles in your fingers straight. Think of forming a tabletop with your fingers. Hold this position for __________ seconds. Extend your fingers back to the starting position, stretching every joint fully. Repeat this exercise 5-10 times with each hand. Finger spread Place your hand flat on a table with your palm facing down. Make sure your wrist stays straight as you do this exercise. Spread your fingers and thumb apart from each other as far as you can until you feel a gentle stretch. Hold this position for __________ seconds. Bring your fingers and thumb tight together again. Hold this position for __________ seconds.  Repeat this exercise 5-10 times with each hand. Making circles Stand or sit with your arm, hand, and all five fingers pointed straight up. Make sure to keep your wrist straight during the exercise. Make a circle  by touching the tip of your thumb to the tip of your index finger. Hold for __________ seconds. Then open your hand wide. Repeat this motion with your thumb and each finger on your hand. Repeat this exercise 5-10 times with each hand. Thumb motion Sit with your forearm resting on a table and your wrist straight. Your thumb should be facing up toward the ceiling. Keep your fingers relaxed as you move your thumb. Lift your thumb up as high as you can toward the ceiling. Hold for __________ seconds. Bend your thumb across your palm as far as you can, reaching the tip of your thumb for the small finger (pinkie) side of your palm. Hold for __________ seconds. Repeat this exercise 5-10 times with each hand. Grip strengthening  Hold a stress ball or other soft ball in the middle of your hand. Slowly increase the pressure, squeezing the ball as much as you can without causing pain. Think of bringing the tips of your fingers into the middle of your palm. All of your finger joints should bend when doing this exercise. Hold your squeeze for __________ seconds, then relax. Repeat this exercise 5-10 times with each hand. Contact a health care provider if: Your hand pain or discomfort gets much worse when you do an exercise. Your hand pain or discomfort does not improve within 2 hours after you exercise. If you have any of these problems, stop doing these exercises right away. Do not do them again unless your health care provider says that you can. Get help right away if: You develop sudden, severe hand pain or swelling. If this happens, stop doing these exercises right away. Do not do them again unless your health care provider says that you can. This information is not intended to replace advice given to you by your health care provider. Make sure you discuss any questions you have with your healthcare provider. Document Revised: 03/13/2019 Document Reviewed: 11/21/2018 [employer] Patient Education  2022  [employer] Back Exercises The following exercises strengthen the muscles that help to support the trunk and back. They also help to keep the lower back flexible. Doing these exercises can help to prevent back pain or lessen existing pain. If you have back pain or discomfort, try doing these exercises 2-3 times each day or as told by your health care provider. As your pain improves, do them once each day, but increase the number of times that you repeat the steps for each exercise (do more repetitions). To prevent the recurrence of back pain, continue to do these exercises once each day or as told by your health care provider. Do exercises exactly as told by your health care provider and adjust them as directed. It is normal to feel mild stretching, pulling, tightness, or discomfort as you do these exercises, but you should stop right away if youfeel sudden pain or your pain gets worse. Exercises Single knee to chest Repeat these steps 3-5 times for each leg: Lie on your back on a firm bed or the floor with your legs extended. Bring one knee to your chest. Your other leg should stay extended and in contact with the floor. Hold your knee in place by grabbing your knee or thigh with both hands and hold. Pull on your knee  until you feel a gentle stretch in your lower back or buttocks. Hold the stretch for 10-30 seconds. Slowly release and straighten your leg. Pelvic tilt Repeat these steps 5-10 times: Lie on your back on a firm bed or the floor with your legs extended. Bend your knees so they are pointing toward the ceiling and your feet are flat on the floor. Tighten your lower abdominal muscles to press your lower back against the floor. This motion will tilt your pelvis so your tailbone points up toward the ceiling instead of pointing to your feet or the floor. With gentle tension and even breathing, hold this position for 5-10 seconds. Cat-cow Repeat these steps until your lower back  becomes more flexible: Get into a hands-and-knees position on a firm surface. Keep your hands under your shoulders, and keep your knees under your hips. You may place padding under your knees for comfort. Let your head hang down toward your chest. Contract your abdominal muscles and point your tailbone toward the floor so your lower back becomes rounded like the back of a cat. Hold this position for 5 seconds. Slowly lift your head, let your abdominal muscles relax and point your tailbone up toward the ceiling so your back forms a sagging arch like the back of a cow. Hold this position for 5 seconds.  Press-ups Repeat these steps 5-10 times: Lie on your abdomen (face-down) on the floor. Place your palms near your head, about shoulder-width apart. Keeping your back as relaxed as possible and keeping your hips on the floor, slowly straighten your arms to raise the top half of your body and lift your shoulders. Do not use your back muscles to raise your upper torso. You may adjust the placement of your hands to make yourself more comfortable. Hold this position for 5 seconds while you keep your back relaxed. Slowly return to lying flat on the floor.  Bridges Repeat these steps 10 times: Lie on your back on a firm surface. Bend your knees so they are pointing toward the ceiling and your feet are flat on the floor. Your arms should be flat at your sides, next to your body. Tighten your buttocks muscles and lift your buttocks off the floor until your waist is at almost the same height as your knees. You should feel the muscles working in your buttocks and the back of your thighs. If you do not feel these muscles, slide your feet 1-2 inches farther away from your buttocks. Hold this position for 3-5 seconds. Slowly lower your hips to the starting position, and allow your buttocks muscles to relax completely. If this exercise is too easy, try doing it with your arms crossed over yourchest. Abdominal  crunches Repeat these steps 5-10 times: Lie on your back on a firm bed or the floor with your legs extended. Bend your knees so they are pointing toward the ceiling and your feet are flat on the floor. Cross your arms over your chest. Tip your chin slightly toward your chest without bending your neck. Tighten your abdominal muscles and slowly raise your trunk (torso) high enough to lift your shoulder blades a tiny bit off the floor. Avoid raising your torso higher than that because it can put too much stress on your low back and does not help to strengthen your abdominal muscles. Slowly return to your starting position. Back lifts Repeat these steps 5-10 times: Lie on your abdomen (face-down) with your arms at your sides, and rest your forehead on  the floor. Tighten the muscles in your legs and your buttocks. Slowly lift your chest off the floor while you keep your hips pressed to the floor. Keep the back of your head in line with the curve in your back. Your eyes should be looking at the floor. Hold this position for 3-5 seconds. Slowly return to your starting position. Contact a health care provider if: Your back pain or discomfort gets much worse when you do an exercise. Your worsening back pain or discomfort does not lessen within 2 hours after you exercise. If you have any of these problems, stop doing these exercises right away. Do not do them again unless your health care provider says that you can. Get help right away if: You develop sudden, severe back pain. If this happens, stop doing the exercises right away. Do not do them again unless your health care provider says that you can. This information is not intended to replace advice given to you by your health care provider. Make sure you discuss any questions you have with your healthcare provider. Document Revised: 03/27/2019 Document Reviewed: 08/22/2018 Elsevier Patient Education  2022 [employer].

## 2021-10-17 ENCOUNTER — Other Ambulatory Visit: Payer: Self-pay | Admitting: Internal Medicine

## 2021-10-17 DIAGNOSIS — Z139 Encounter for screening, unspecified: Secondary | ICD-10-CM

## 2021-10-26 ENCOUNTER — Other Ambulatory Visit: Payer: Self-pay

## 2021-10-26 ENCOUNTER — Ambulatory Visit
Admission: RE | Admit: 2021-10-26 | Discharge: 2021-10-26 | Disposition: A | Discharge status: Home / Self Care | Payer: Medicare Other | Source: Ambulatory Visit | Attending: Internal Medicine | Admitting: Internal Medicine

## 2021-10-26 DIAGNOSIS — Z139 Encounter for screening, unspecified: Secondary | ICD-10-CM

## 2022-01-04 NOTE — Progress Notes (Signed)
Office Visit Note  Patient: Betty Wright             Date of Birth: Sep 18, 1956           MRN: [id number]             PCP: Kirstie Peri, MD Referring: Kirstie Peri, MD Visit Date: 01/17/2022 Occupation: @GUAROCC @  Subjective:  Other (Worsening right shoulder pain)   History of Present Illness: Betty Wright is a 66 y.o. female with a history of osteoarthritis, fibromyalgia, positive ANA and osteopenia.  She states she continues to have pain and discomfort in her hands and her knee joints.  She has been experiencing increased pain in her right shoulder for the last few months.  She states she has been experiencing increased pain recently.  She has difficulty reaching her bra..  She states the pain gets worse when she is on the sewing machine.  She has been asked also experiencing some discomfort in her left SI joint.  She continues to have some generalized pain from fibromyalgia.  There is no history of oral ulcers, nasal ulcers, malar rash, photosensitivity, Raynaud's phenomenon or lymphadenopathy.  Activities of Daily Living:  Patient reports morning stiffness for 30-60 minutes.   Patient Reports nocturnal pain.  Difficulty dressing/grooming: Denies Difficulty climbing stairs: Denies Difficulty getting out of chair: Denies Difficulty using hands for taps, buttons, cutlery, and/or writing: Denies  Review of Systems  Constitutional:  Positive for fatigue.  HENT:  Positive for nose dryness. Negative for mouth sores and mouth dryness.   Eyes:  Positive for dryness. Negative for pain and itching.  Respiratory:  Negative for shortness of breath and difficulty breathing.   Cardiovascular:  Negative for chest pain and palpitations.  Gastrointestinal:  Positive for diarrhea. Negative for blood in stool and constipation.  Endocrine: Negative for increased urination.  Genitourinary:  Negative for difficulty urinating.  Musculoskeletal:  Positive for joint pain, joint pain and  morning stiffness. Negative for joint swelling, myalgias, muscle tenderness and myalgias.  Skin:  Negative for color change, rash, redness and sensitivity to sunlight.  Allergic/Immunologic: Negative for susceptible to infections.  Neurological:  Negative for dizziness, numbness, headaches, memory loss and weakness.  Hematological:  Negative for bruising/bleeding tendency and swollen glands.  Psychiatric/Behavioral:  Negative for depressed mood, confusion and sleep disturbance. The patient is not nervous/anxious.    PMFS History:  Patient Active Problem List   Diagnosis Date Noted   Primary osteoarthritis of both hands 07/19/2021   Primary osteoarthritis of both feet 07/19/2021   Primary osteoarthritis of both knees 07/19/2021   Chronic left SI joint pain 07/19/2021   Fibromyalgia 07/19/2021   Vitamin D deficiency 07/19/2021   History of hyperlipidemia 07/19/2021   Essential hypertension 07/19/2021   History of gastroesophageal reflux (GERD) 07/19/2021   History of type 2 diabetes mellitus 07/19/2021   History of chronic kidney disease 07/19/2021   Osteopenia 07/19/2021    Past Medical History:  Diagnosis Date   Diabetes (HCC)    Fibromyalgia    Hypertension    Osteopenia    Rheumatoid arthritis (HCC)     Family History  Adopted: Yes  Problem Relation Age of Onset   Healthy Daughter    Healthy Son    Breast cancer Neg Hx    Past Surgical History:  Procedure Laterality Date   BREAST EXCISIONAL BIOPSY Left    benign   BREAST SURGERY Left    lumpectomy    CESAREAN SECTION  times two   PARTIAL HYSTERECTOMY     TUBAL LIGATION     WISDOM TOOTH EXTRACTION     Social History   Social History Narrative   3 level home with husband; R handed ; high school grad; 1 cup coffee every am; exercise not much;    Immunization History  Administered Date(s) Administered   [employer] Vaccination 01/29/2020, 02/27/2020, 10/21/2020     Objective: Vital Signs: BP (!)  148/82 (BP Location: Left Arm, Patient Position: Sitting, Cuff Size: Normal)    Pulse 79    Ht 5\' 2"  (1.575 m)    Wt 156 lb 9.6 oz (71 kg)    BMI 28.64 kg/m    Physical Exam Vitals and nursing note reviewed.  Constitutional:      Appearance: She is well-developed.  HENT:     Head: Normocephalic and atraumatic.  Eyes:     Conjunctiva/sclera: Conjunctivae normal.  Cardiovascular:     Rate and Rhythm: Normal rate and regular rhythm.     Heart sounds: Normal heart sounds.  Pulmonary:     Effort: Pulmonary effort is normal.     Breath sounds: Normal breath sounds.  Abdominal:     General: Bowel sounds are normal.     Palpations: Abdomen is soft.  Musculoskeletal:     Cervical back: Normal range of motion.  Lymphadenopathy:     Cervical: No cervical adenopathy.  Skin:    General: Skin is warm and dry.     Capillary Refill: Capillary refill takes less than 2 seconds.  Neurological:     Mental Status: She is alert and oriented to person, place, and time.  Psychiatric:        Behavior: Behavior normal.     Musculoskeletal Exam: C-spine was in good range of motion.  She had discomfort with abduction and internal rotation of her right shoulder joint.  Left shoulder joint was in good range of motion.  Elbow joints with good range of motion.  She had bilateral PIP and DIP thickening with no synovitis over MCPs or wrist joints.  Hip joints and knee joints with good range of motion.  No warmth swelling or effusion was noted.  There was no tenderness over ankles or MTPs.  She had generalized hyperalgesia and positive tender points.  CDAI Exam: CDAI Score: -- Patient Global: --; Provider Global: -- Swollen: --; Tender: -- Joint Exam 01/17/2022   No joint exam has been documented for this visit   There is currently no information documented on the homunculus. Go to the Rheumatology activity and complete the homunculus joint exam.  Investigation: No additional findings.  Imaging: No  results found.  Recent Labs: Lab Results  Component Value Date   WBC 8.3 02/28/2021   HGB 11.8 02/28/2021   PLT 353 02/28/2021   NA 140 02/25/2021   K 4.2 02/25/2021   CL 103 02/25/2021   CO2 23 02/25/2021   GLUCOSE 114 (H) 02/25/2021   BUN 19 02/25/2021   CREATININE 1.04 (H) 02/25/2021   BILITOT 0.4 02/25/2021   AST 18 02/25/2021   ALT 9 02/25/2021   PROT 7.1 02/28/2021   CALCIUM 10.0 02/25/2021   GFRAA 65 02/25/2021   QFTBGOLDPLUS NEGATIVE 02/28/2021    Speciality Comments: No specialty comments available.  Procedures:  No procedures performed Allergies: Erythromycin, Flonase [fluticasone], Influenza a (h1n1) monovalent vaccine, and Penicillins   Assessment / Plan:     Visit Diagnoses: Chronic right shoulder pain -she has been experiencing pain and  discomfort in her right shoulder joint for the last few months.  The pain has increased recently.  She had difficulty with abduction and internal rotation.  Plan: XR Shoulder Right.  X-rays of the shoulder joint were unremarkable.  X-ray findings were reviewed with the patient.  Exercises were demonstrated in the office and A handout on shoulder joint exercises was given.  Primary osteoarthritis of both hands - All autoimmune labs negative.  Sed rate 38.  She was diagnosed with RA by Dr. Cristal Deer and was treated with prednisone and methotrexate.  She had no synovitis on my examination.  I advised her to contact us in case she develops swelling.  Primary osteoarthritis of both knees - X-ray showed mild osteoarthritis and moderate chondromalacia patella.  Joint protection muscle strengthening was discussed.  Primary osteoarthritis of both feet - X-rays consistent with early osteoarthritis.  Proper fitting shoes were discussed.  Chronic left SI joint pain - X-rays were unremarkable.  She continues to have some discomfort in her left SI joint off-and-on.  High risk medication use - MTX since 2018.  Discontinued January 2022.  She was  diagnosed with rheumatoid arthritis by Dr. Scarlette Shorts in the past and was treated with methotrexate for 4 years.  I took her off methotrexate in 2022.  She has been doing well without any synovitis.  Positive ANA (antinuclear antibody) - History of progression positive ANA in early 2000's patient.  ANA is a still positive but ANA is negative and complements are normal.  She has no clinical features of autoimmune disease.  She denies any history of oral ulcers, nasal ulcers, sicca symptoms, malar rash, photosensitivity, Raynaud's phenomenon or lymphadenopathy.  Fibromyalgia - Diagnosed with fibromyalgia syndrome by Dr. Amil Amen and Dr. Cristal Deer.  She has generalized pain and positive tender points.  Osteopenia of multiple sites - July 01, 2021 DEXA done at [facility] internal medicine T score -1.4 right femoral neck.  Vitamin D deficiency-she is on vitamin D supplement.  Essential hypertension-blood pressure was mildly elevated today.  Other medical problems are listed as follows:  History of hyperlipidemia  History of chronic kidney disease  History of gastroesophageal reflux (GERD)  History of type 2 diabetes mellitus  Orders: Orders Placed This Encounter  Procedures   XR Shoulder Right   No orders of the defined types were placed in this encounter.    Follow-Up Instructions: Return in about 6 months (around 07/17/2022) for Osteoarthritis.   Bo Merino, MD  Note - This record has been created using Editor, commissioning.  Chart creation errors have been sought, but may not always  have been located. Such creation errors do not reflect on  the standard of medical care.

## 2022-01-17 ENCOUNTER — Encounter: Payer: Self-pay | Admitting: Rheumatology

## 2022-01-17 ENCOUNTER — Ambulatory Visit: Payer: Self-pay

## 2022-01-17 ENCOUNTER — Other Ambulatory Visit: Payer: Self-pay

## 2022-01-17 ENCOUNTER — Ambulatory Visit: Payer: Medicare Other | Admitting: Rheumatology

## 2022-01-17 VITALS — BP 148/82 | HR 79 | Ht 62.0 in | Wt 156.6 lb

## 2022-01-17 DIAGNOSIS — R768 Other specified abnormal immunological findings in serum: Secondary | ICD-10-CM

## 2022-01-17 DIAGNOSIS — M19071 Primary osteoarthritis, right ankle and foot: Secondary | ICD-10-CM | POA: Diagnosis not present

## 2022-01-17 DIAGNOSIS — M19041 Primary osteoarthritis, right hand: Secondary | ICD-10-CM

## 2022-01-17 DIAGNOSIS — Z8639 Personal history of other endocrine, nutritional and metabolic disease: Secondary | ICD-10-CM

## 2022-01-17 DIAGNOSIS — M797 Fibromyalgia: Secondary | ICD-10-CM

## 2022-01-17 DIAGNOSIS — Z79899 Other long term (current) drug therapy: Secondary | ICD-10-CM

## 2022-01-17 DIAGNOSIS — G8929 Other chronic pain: Secondary | ICD-10-CM | POA: Diagnosis not present

## 2022-01-17 DIAGNOSIS — M19042 Primary osteoarthritis, left hand: Secondary | ICD-10-CM

## 2022-01-17 DIAGNOSIS — E559 Vitamin D deficiency, unspecified: Secondary | ICD-10-CM

## 2022-01-17 DIAGNOSIS — M25511 Pain in right shoulder: Secondary | ICD-10-CM | POA: Diagnosis not present

## 2022-01-17 DIAGNOSIS — M8589 Other specified disorders of bone density and structure, multiple sites: Secondary | ICD-10-CM

## 2022-01-17 DIAGNOSIS — M19072 Primary osteoarthritis, left ankle and foot: Secondary | ICD-10-CM

## 2022-01-17 DIAGNOSIS — Z87448 Personal history of other diseases of urinary system: Secondary | ICD-10-CM

## 2022-01-17 DIAGNOSIS — I10 Essential (primary) hypertension: Secondary | ICD-10-CM

## 2022-01-17 DIAGNOSIS — Z8719 Personal history of other diseases of the digestive system: Secondary | ICD-10-CM

## 2022-01-17 DIAGNOSIS — M170 Bilateral primary osteoarthritis of knee: Secondary | ICD-10-CM | POA: Diagnosis not present

## 2022-01-17 DIAGNOSIS — R7689 Other specified abnormal immunological findings in serum: Secondary | ICD-10-CM

## 2022-01-17 DIAGNOSIS — M533 Sacrococcygeal disorders, not elsewhere classified: Secondary | ICD-10-CM

## 2022-01-17 NOTE — Patient Instructions (Signed)
Shoulder Exercises Ask your health care provider which exercises are safe for you. Do exercises exactly as told by your health care provider and adjust them as directed. It is normal to feel mild stretching, pulling, tightness, or discomfort as you do these exercises. Stop right away if you feel sudden pain or your pain gets worse. Do not begin these exercises until told by your health care provider. Stretching exercises External rotation and abduction This exercise is sometimes called corner stretch. This exercise rotates your arm outward (external rotation) and moves your arm out from your body (abduction). Stand in a doorway with one of your feet slightly in front of the other. This is called a staggered stance. If you cannot reach your forearms to the door frame, stand facing a corner of a room. Choose one of the following positions as told by your health care provider: Place your hands and forearms on the door frame above your head. Place your hands and forearms on the door frame at the height of your head. Place your hands on the door frame at the height of your elbows. Slowly move your weight onto your front foot until you feel a stretch across your chest and in the front of your shoulders. Keep your head and chest upright and keep your abdominal muscles tight. Hold for __________ seconds. To release the stretch, shift your weight to your back foot. Repeat __________ times. Complete this exercise __________ times a day. Extension, standing Stand and hold a broomstick, a cane, or a similar object behind your back. Your hands should be a little wider than shoulder width apart. Your palms should face away from your back. Keeping your elbows straight and your shoulder muscles relaxed, move the stick away from your body until you feel a stretch in your shoulders (extension). Avoid shrugging your shoulders while you move the stick. Keep your shoulder blades tucked down toward the middle of your  back. Hold for __________ seconds. Slowly return to the starting position. Repeat __________ times. Complete this exercise __________ times a day. Range-of-motion exercises Pendulum  Stand near a wall or a surface that you can hold onto for balance. Bend at the waist and let your left / right arm hang straight down. Use your other arm to support you. Keep your back straight and do not lock your knees. Relax your left / right arm and shoulder muscles, and move your hips and your trunk so your left / right arm swings freely. Your arm should swing because of the motion of your body, not because you are using your arm or shoulder muscles. Keep moving your hips and trunk so your arm swings in the following directions, as told by your health care provider: Side to side. Forward and backward. In clockwise and counterclockwise circles. Continue each motion for __________ seconds, or for as long as told by your health care provider. Slowly return to the starting position. Repeat __________ times. Complete this exercise __________ times a day. Shoulder flexion, standing  Stand and hold a broomstick, a cane, or a similar object. Place your hands a little more than shoulder width apart on the object. Your left / right hand should be palm up, and your other hand should be palm down. Keep your elbow straight and your shoulder muscles relaxed. Push the stick up with your healthy arm to raise your left / right arm in front of your body, and then over your head until you feel a stretch in your shoulder (flexion). Avoid   shrugging your shoulder while you raise your arm. Keep your shoulder blade tucked down toward the middle of your back. Hold for __________ seconds. Slowly return to the starting position. Repeat __________ times. Complete this exercise __________ times a day. Shoulder abduction, standing Stand and hold a broomstick, a cane, or a similar object. Place your hands a little more than shoulder  width apart on the object. Your left / right hand should be palm up, and your other hand should be palm down. Keep your elbow straight and your shoulder muscles relaxed. Push the object across your body toward your left / right side. Raise your left / right arm to the side of your body (abduction) until you feel a stretch in your shoulder. Do not raise your arm above shoulder height unless your health care provider tells you to do that. If directed, raise your arm over your head. Avoid shrugging your shoulder while you raise your arm. Keep your shoulder blade tucked down toward the middle of your back. Hold for __________ seconds. Slowly return to the starting position. Repeat __________ times. Complete this exercise __________ times a day. Internal rotation  Place your left / right hand behind your back, palm up. Use your other hand to dangle an exercise band, a towel, or a similar object over your shoulder. Grasp the band with your left / right hand so you are holding on to both ends. Gently pull up on the band until you feel a stretch in the front of your left / right shoulder. The movement of your arm toward the center of your body is called internal rotation. Avoid shrugging your shoulder while you raise your arm. Keep your shoulder blade tucked down toward the middle of your back. Hold for __________ seconds. Release the stretch by letting go of the band and lowering your hands. Repeat __________ times. Complete this exercise __________ times a day. Strengthening exercises External rotation  Sit in a stable chair without armrests. Secure an exercise band to a stable object at elbow height on your left / right side. Place a soft object, such as a folded towel or a small pillow, between your left / right upper arm and your body to move your elbow about 4 inches (10 cm) away from your side. Hold the end of the exercise band so it is tight and there is no slack. Keeping your elbow pressed  against the soft object, slowly move your forearm out, away from your abdomen (external rotation). Keep your body steady so only your forearm moves. Hold for __________ seconds. Slowly return to the starting position. Repeat __________ times. Complete this exercise __________ times a day. Shoulder abduction  Sit in a stable chair without armrests, or stand up. Hold a __________ weight in your left / right hand, or hold an exercise band with both hands. Start with your arms straight down and your left / right palm facing in, toward your body. Slowly lift your left / right hand out to your side (abduction). Do not lift your hand above shoulder height unless your health care provider tells you that this is safe. Keep your arms straight. Avoid shrugging your shoulder while you do this movement. Keep your shoulder blade tucked down toward the middle of your back. Hold for __________ seconds. Slowly lower your arm, and return to the starting position. Repeat __________ times. Complete this exercise __________ times a day. Shoulder extension Sit in a stable chair without armrests, or stand up. Secure an exercise band   to a stable object in front of you so it is at shoulder height. Hold one end of the exercise band in each hand. Your palms should face each other. Straighten your elbows and lift your hands up to shoulder height. Step back, away from the secured end of the exercise band, until the band is tight and there is no slack. Squeeze your shoulder blades together as you pull your hands down to the sides of your thighs (extension). Stop when your hands are straight down by your sides. Do not let your hands go behind your body. Hold for __________ seconds. Slowly return to the starting position. Repeat __________ times. Complete this exercise __________ times a day. Shoulder row Sit in a stable chair without armrests, or stand up. Secure an exercise band to a stable object in front of you so it  is at waist height. Hold one end of the exercise band in each hand. Position your palms so that your thumbs are facing the ceiling (neutral position). Bend each of your elbows to a 90-degree angle (right angle) and keep your upper arms at your sides. Step back until the band is tight and there is no slack. Slowly pull your elbows back behind you. Hold for __________ seconds. Slowly return to the starting position. Repeat __________ times. Complete this exercise __________ times a day. Shoulder press-ups  Sit in a stable chair that has armrests. Sit upright, with your feet flat on the floor. Put your hands on the armrests so your elbows are bent and your fingers are pointing forward. Your hands should be about even with the sides of your body. Push down on the armrests and use your arms to lift yourself off the chair. Straighten your elbows and lift yourself up as much as you comfortably can. Move your shoulder blades down, and avoid letting your shoulders move up toward your ears. Keep your feet on the ground. As you get stronger, your feet should support less of your body weight as you lift yourself up. Hold for __________ seconds. Slowly lower yourself back into the chair. Repeat __________ times. Complete this exercise __________ times a day. Wall push-ups  Stand so you are facing a stable wall. Your feet should be about one arm-length away from the wall. Lean forward and place your palms on the wall at shoulder height. Keep your feet flat on the floor as you bend your elbows and lean forward toward the wall. Hold for __________ seconds. Straighten your elbows to push yourself back to the starting position. Repeat __________ times. Complete this exercise __________ times a day. This information is not intended to replace advice given to you by your health care provider. Make sure you discuss any questions you have with your healthcare provider. Document Revised: 03/14/2019 Document  Reviewed: 12/20/2018 Elsevier Patient Education  2022 [employer]  

## 2022-04-14 DIAGNOSIS — R002 Palpitations: Secondary | ICD-10-CM | POA: Diagnosis not present

## 2022-06-09 DIAGNOSIS — E1165 Type 2 diabetes mellitus with hyperglycemia: Secondary | ICD-10-CM | POA: Diagnosis not present

## 2022-06-09 DIAGNOSIS — E1122 Type 2 diabetes mellitus with diabetic chronic kidney disease: Secondary | ICD-10-CM | POA: Diagnosis not present

## 2022-06-09 DIAGNOSIS — Z0000 Encounter for general adult medical examination without abnormal findings: Secondary | ICD-10-CM | POA: Diagnosis not present

## 2022-06-09 DIAGNOSIS — E7800 Pure hypercholesterolemia, unspecified: Secondary | ICD-10-CM | POA: Diagnosis not present

## 2022-06-29 IMAGING — MG MM DIGITAL SCREENING BILAT W/ TOMO AND CAD
8 series · 8 of 24 positions shown · non-contrast
Comparison: Previous exam(s).

CLINICAL DATA: Screening.

EXAM:
DIGITAL SCREENING BILATERAL MAMMOGRAM WITH TOMOSYNTHESIS AND CAD
TECHNIQUE: Bilateral screening digital craniocaudal and mediolateral oblique
mammograms were obtained. Bilateral screening digital breast
tomosynthesis was performed. The images were evaluated with
computer-aided detection.

[L MLO synth-2D]
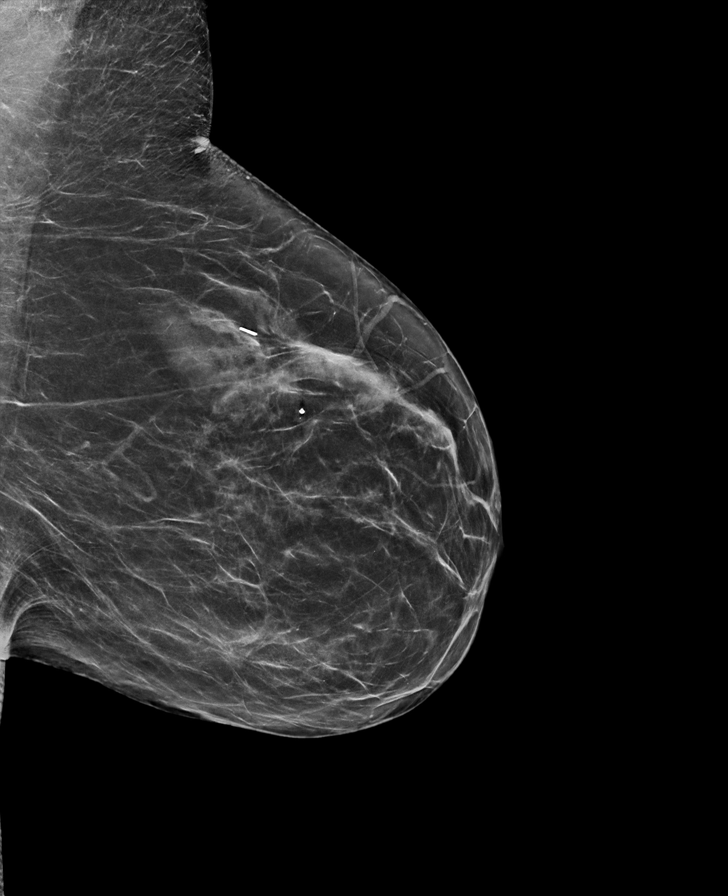

[R CC synth-2D]
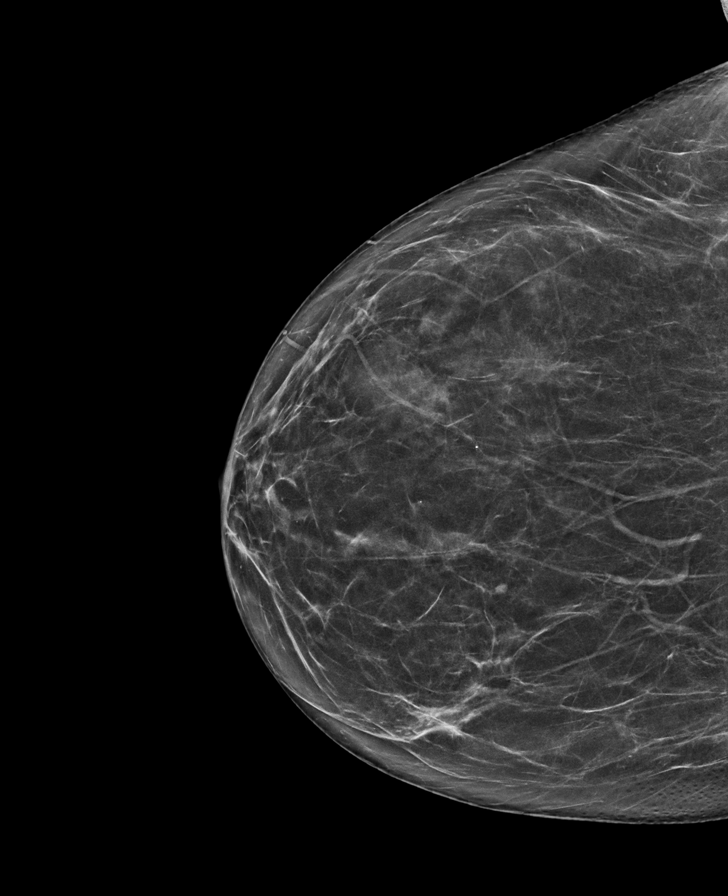

[L CC synth-2D]
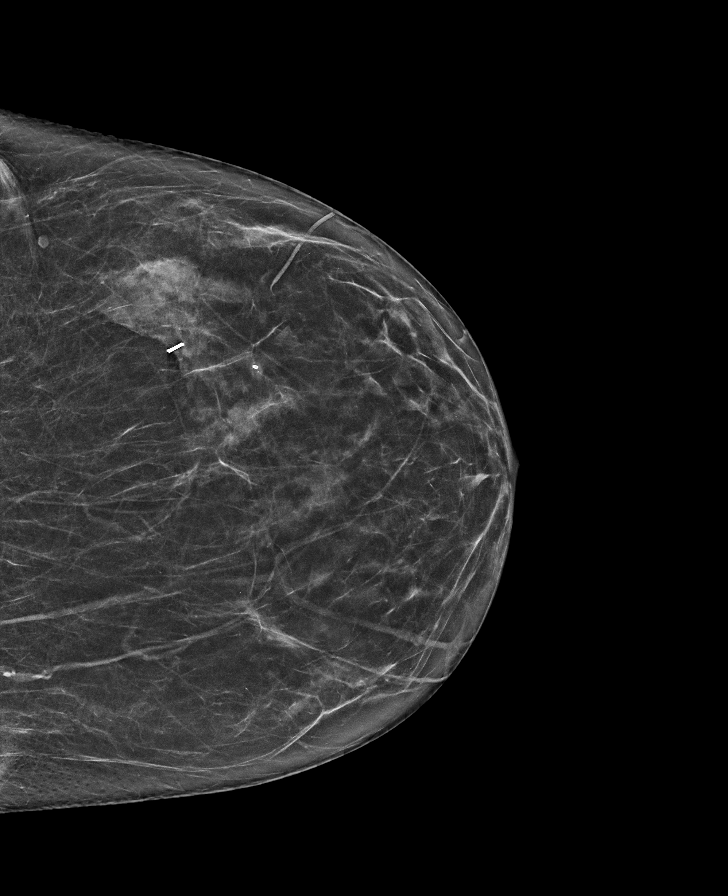

[R MLO synth-2D]
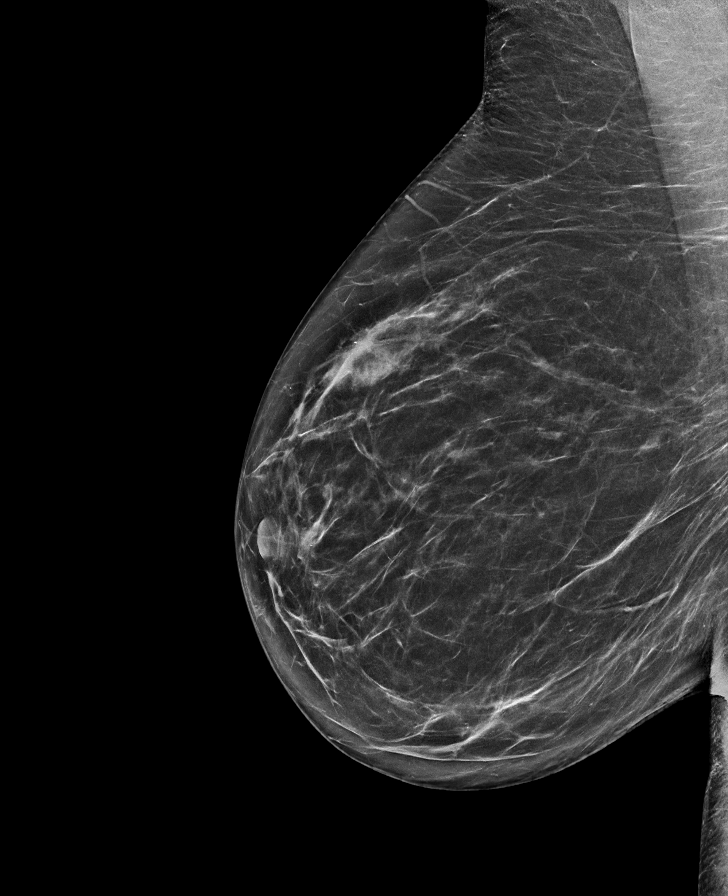

[L CC tomo · tomo slice 31/60.0]
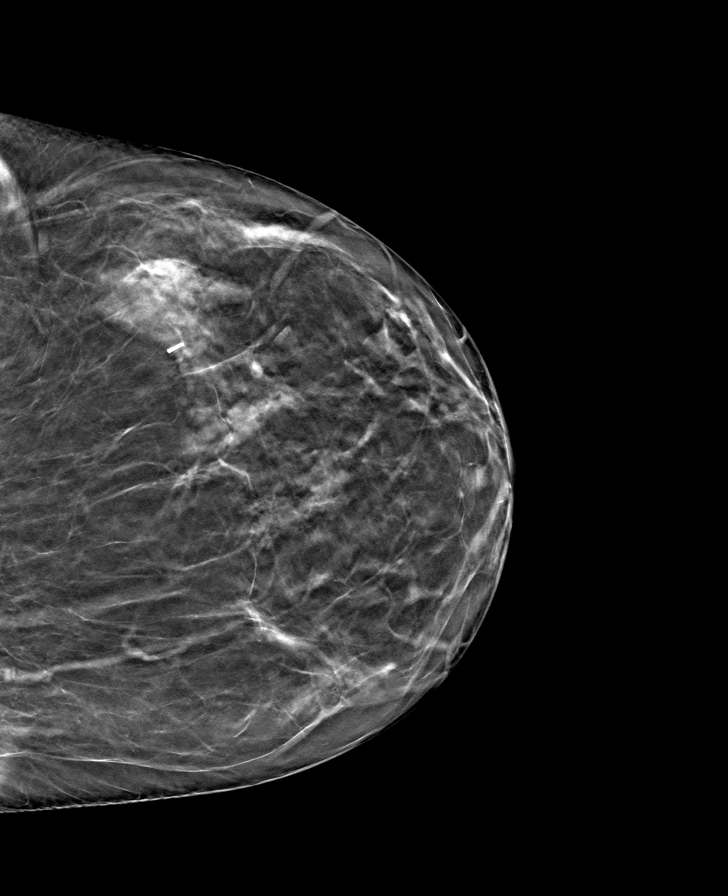

[L MLO tomo · tomo slice 37/74.0]
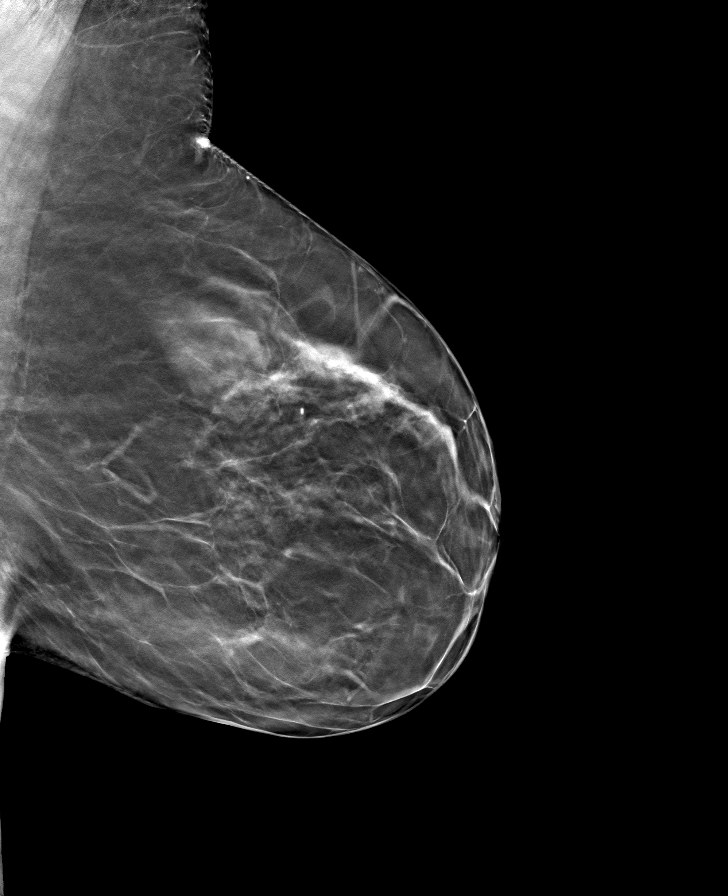

[R CC tomo · tomo slice 35/69.0]
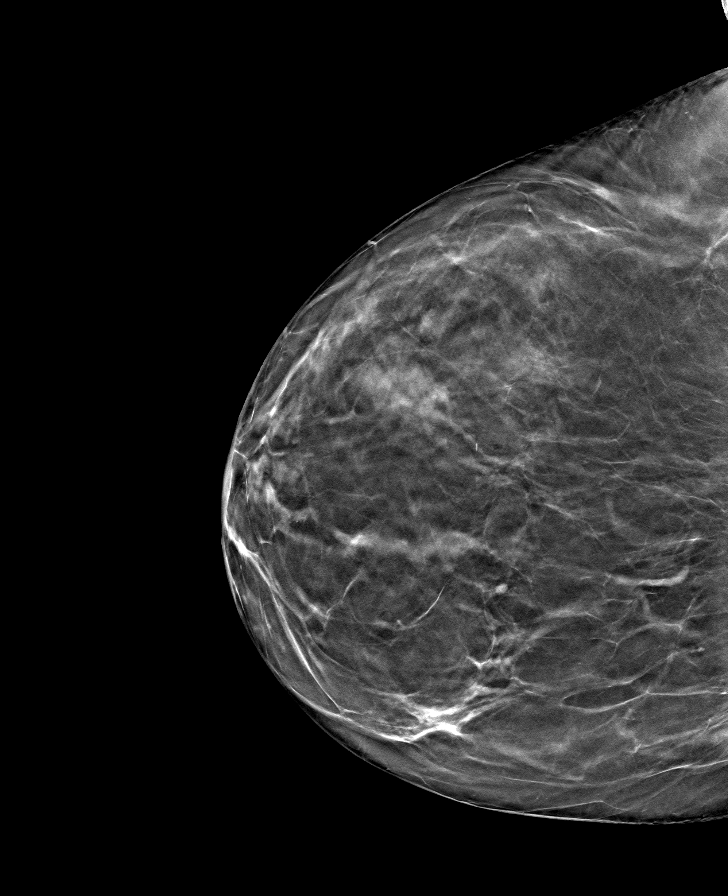

[R MLO tomo · tomo slice 39/76.0]
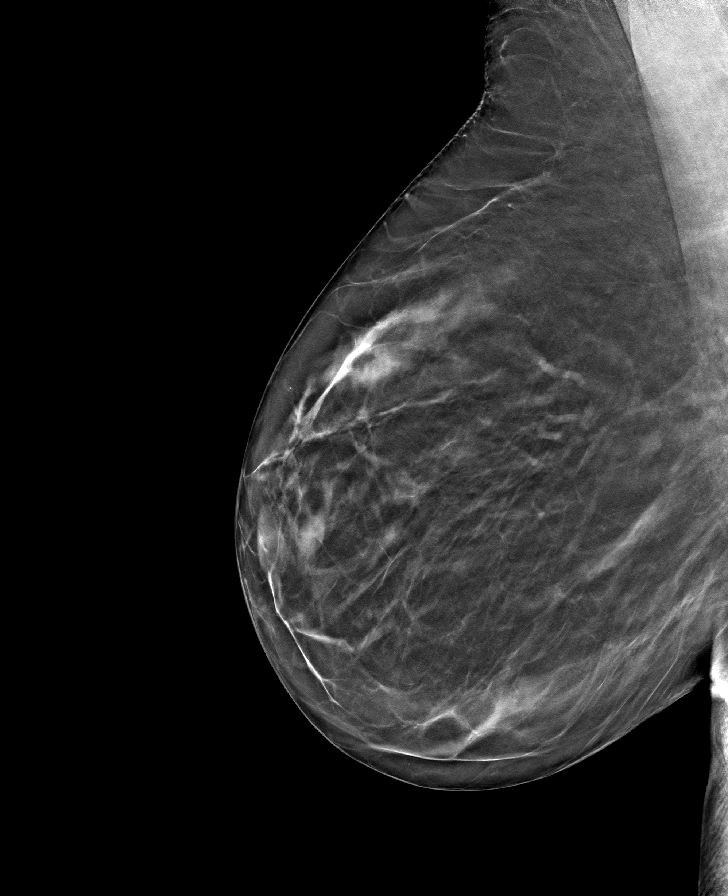

[8 of 24 positions shown; findings below may reference images not displayed]

ACR Breast Density Category b: There are scattered areas of
fibroglandular density.
FINDINGS: There are no findings suspicious for malignancy.
IMPRESSION: No mammographic evidence of malignancy. A result letter of this
screening mammogram will be mailed directly to the patient.

RECOMMENDATION:
Screening mammogram in one year. (Code:51-O-LD2)

BI-RADS CATEGORY  1: Negative.

## 2022-07-05 NOTE — Progress Notes (Deleted)
Office Visit Note  Patient: Betty Wright             Date of Birth: [birth year]           MRN: [id number]             PCP: Kirstie Peri, MD Referring: Kirstie Peri, MD Visit Date: 07/18/2022 Occupation: @GUAROCC @  Subjective:  No chief complaint on file.   History of Present Illness: Betty Wright is a 66 y.o. female ***   Activities of Daily Living:  Patient reports morning stiffness for *** {minute/hour:19697}.   Patient {ACTIONS;DENIES/REPORTS:21021675::"Denies"} nocturnal pain.  Difficulty dressing/grooming: {ACTIONS;DENIES/REPORTS:21021675::"Denies"} Difficulty climbing stairs: {ACTIONS;DENIES/REPORTS:21021675::"Denies"} Difficulty getting out of chair: {ACTIONS;DENIES/REPORTS:21021675::"Denies"} Difficulty using hands for taps, buttons, cutlery, and/or writing: {ACTIONS;DENIES/REPORTS:21021675::"Denies"}  No Rheumatology ROS completed.   PMFS History:  Patient Active Problem List   Diagnosis Date Noted   Primary osteoarthritis of both hands 07/19/2021   Primary osteoarthritis of both feet 07/19/2021   Primary osteoarthritis of both knees 07/19/2021   Chronic left SI joint pain 07/19/2021   Fibromyalgia 07/19/2021   Vitamin D deficiency 07/19/2021   History of hyperlipidemia 07/19/2021   Essential hypertension 07/19/2021   History of gastroesophageal reflux (GERD) 07/19/2021   History of type 2 diabetes mellitus 07/19/2021   History of chronic kidney disease 07/19/2021   Osteopenia 07/19/2021    Past Medical History:  Diagnosis Date   Diabetes (HCC)    Fibromyalgia    Hypertension    Osteopenia    Rheumatoid arthritis (HCC)     Family History  Adopted: Yes  Problem Relation Age of Onset   Healthy Daughter    Healthy Son    Breast cancer Neg Hx    Past Surgical History:  Procedure Laterality Date   BREAST EXCISIONAL BIOPSY Left    benign   BREAST SURGERY Left    lumpectomy    CESAREAN SECTION     times two   PARTIAL HYSTERECTOMY      TUBAL LIGATION     WISDOM TOOTH EXTRACTION     Social History   Social History Narrative   3 level home with husband; R handed ; high school grad; 1 cup coffee every am; exercise not much;    Immunization History  Administered Date(s) Administered   07/21/2021 Vaccination 01/29/2020, 02/27/2020, 10/21/2020     Objective: Vital Signs: There were no vitals taken for this visit.   Physical Exam   Musculoskeletal Exam: ***  CDAI Exam: CDAI Score: -- Patient Global: --; Provider Global: -- Swollen: --; Tender: -- Joint Exam 07/18/2022   No joint exam has been documented for this visit   There is currently no information documented on the homunculus. Go to the Rheumatology activity and complete the homunculus joint exam.  Investigation: No additional findings.  Imaging: No results found.  Recent Labs: Lab Results  Component Value Date   WBC 8.3 02/28/2021   HGB 11.8 02/28/2021   PLT 353 02/28/2021   NA 140 02/25/2021   K 4.2 02/25/2021   CL 103 02/25/2021   CO2 23 02/25/2021   GLUCOSE 114 (H) 02/25/2021   BUN 19 02/25/2021   CREATININE 1.04 (H) 02/25/2021   BILITOT 0.4 02/25/2021   AST 18 02/25/2021   ALT 9 02/25/2021   PROT 7.1 02/28/2021   CALCIUM 10.0 02/25/2021   GFRAA 65 02/25/2021   QFTBGOLDPLUS NEGATIVE 02/28/2021    Speciality Comments: No specialty comments available.  Procedures:  No procedures performed Allergies: Erythromycin, Flonase [fluticasone],  Influenza a (h1n1) monovalent vaccine, and Penicillins   Assessment / Plan:     Visit Diagnoses: No diagnosis found.  Orders: No orders of the defined types were placed in this encounter.  No orders of the defined types were placed in this encounter.   Face-to-face time spent with patient was *** minutes. Greater than 50% of time was spent in counseling and coordination of care.  Follow-Up Instructions: No follow-ups on file.   Ellen Henri, CMA  Note - This record has  been created using Animal nutritionist.  Chart creation errors have been sought, but may not always  have been located. Such creation errors do not reflect on  the standard of medical care.

## 2022-07-13 DIAGNOSIS — Z299 Encounter for prophylactic measures, unspecified: Secondary | ICD-10-CM | POA: Diagnosis not present

## 2022-07-13 DIAGNOSIS — E1165 Type 2 diabetes mellitus with hyperglycemia: Secondary | ICD-10-CM | POA: Diagnosis not present

## 2022-07-13 DIAGNOSIS — I10 Essential (primary) hypertension: Secondary | ICD-10-CM | POA: Diagnosis not present

## 2022-07-13 DIAGNOSIS — Z1211 Encounter for screening for malignant neoplasm of colon: Secondary | ICD-10-CM | POA: Diagnosis not present

## 2022-07-18 ENCOUNTER — Ambulatory Visit: Payer: Medicare Other | Admitting: Physician Assistant

## 2022-07-18 DIAGNOSIS — Z8639 Personal history of other endocrine, nutritional and metabolic disease: Secondary | ICD-10-CM

## 2022-07-18 DIAGNOSIS — Z8719 Personal history of other diseases of the digestive system: Secondary | ICD-10-CM

## 2022-07-18 DIAGNOSIS — R768 Other specified abnormal immunological findings in serum: Secondary | ICD-10-CM

## 2022-07-18 DIAGNOSIS — M8589 Other specified disorders of bone density and structure, multiple sites: Secondary | ICD-10-CM

## 2022-07-18 DIAGNOSIS — E559 Vitamin D deficiency, unspecified: Secondary | ICD-10-CM

## 2022-07-18 DIAGNOSIS — Z79899 Other long term (current) drug therapy: Secondary | ICD-10-CM

## 2022-07-18 DIAGNOSIS — M19041 Primary osteoarthritis, right hand: Secondary | ICD-10-CM

## 2022-07-18 DIAGNOSIS — Z87448 Personal history of other diseases of urinary system: Secondary | ICD-10-CM

## 2022-07-18 DIAGNOSIS — I10 Essential (primary) hypertension: Secondary | ICD-10-CM

## 2022-07-18 DIAGNOSIS — M797 Fibromyalgia: Secondary | ICD-10-CM

## 2022-07-18 DIAGNOSIS — M19071 Primary osteoarthritis, right ankle and foot: Secondary | ICD-10-CM

## 2022-07-18 DIAGNOSIS — M170 Bilateral primary osteoarthritis of knee: Secondary | ICD-10-CM

## 2022-07-18 DIAGNOSIS — G8929 Other chronic pain: Secondary | ICD-10-CM

## 2022-08-20 NOTE — Progress Notes (Unsigned)
Office Visit Note  Patient: Betty Wright             Date of Birth: [birth year]           MRN: [id number]             PCP: Kirstie Peri, MD Referring: Kirstie Peri, MD Visit Date: 08/31/2022 Occupation: @  Subjective:  Stable   History of Present Illness: Betty Wright is a 66 y.o. female with history of osteoarthritis and fibromyalgia.  Patient reports that overall her symptoms have been stable since her last office visit on 01/17/2022.  She experiences occasional myalgias and arthralgias due to underlying osteoarthritis and fibromyalgia.  She has not noticed any joint swelling.  She continues to take tart cherry and turmeric on a daily basis for the natural anti-inflammatory properties.  She tries to avoid taking Tylenol and NSAIDs over-the-counter for pain relief.  She has also been taking a vitamin D supplement but has not been taking a calcium supplement as frequently.  She denies any recent falls or fractures. Overall her energy level has been stable.  She experiences interrupted sleep at night occasionally. She denies any new medical conditions since her last office visit.  Activities of Daily Living:  Patient reports morning stiffness for 30-60 minutes.   Patient Reports nocturnal pain.  Difficulty dressing/grooming: Denies Difficulty climbing stairs: Denies Difficulty getting out of chair: Denies Difficulty using hands for taps, buttons, cutlery, and/or writing: Reports  Review of Systems  Constitutional:  Positive for fatigue.  HENT:  Negative for mouth sores and mouth dryness.   Eyes:  Positive for dryness.  Respiratory:  Negative for shortness of breath.   Cardiovascular:  Negative for chest pain and palpitations.  Gastrointestinal:  Positive for diarrhea. Negative for blood in stool and constipation.  Endocrine: Negative for increased urination.  Genitourinary:  Negative for involuntary urination.  Musculoskeletal:  Positive for joint pain, joint pain,  myalgias, morning stiffness, muscle tenderness and myalgias. Negative for gait problem, joint swelling and muscle weakness.  Skin:  Negative for color change, rash, hair loss and sensitivity to sunlight.  Allergic/Immunologic: Negative for susceptible to infections.  Neurological:  Negative for dizziness and headaches.  Hematological:  Negative for swollen glands.  Psychiatric/Behavioral:  Negative for depressed mood and sleep disturbance. The patient is not nervous/anxious.     PMFS History:  Patient Active Problem List   Diagnosis Date Noted   Primary osteoarthritis of both hands 07/19/2021   Primary osteoarthritis of both feet 07/19/2021   Primary osteoarthritis of both knees 07/19/2021   Chronic left SI joint pain 07/19/2021   Fibromyalgia 07/19/2021   Vitamin D deficiency 07/19/2021   History of hyperlipidemia 07/19/2021   Essential hypertension 07/19/2021   History of gastroesophageal reflux (GERD) 07/19/2021   History of type 2 diabetes mellitus 07/19/2021   History of chronic kidney disease 07/19/2021   Osteopenia 07/19/2021    Past Medical History:  Diagnosis Date   Diabetes (HCC)    Fibromyalgia    Hypertension    Osteopenia    Rheumatoid arthritis (HCC)     Family History  Adopted: Yes  Problem Relation Age of Onset   Healthy Daughter    Healthy Son    Breast cancer Neg Hx    Past Surgical History:  Procedure Laterality Date   BREAST EXCISIONAL BIOPSY Left    benign   BREAST SURGERY Left    lumpectomy    CESAREAN SECTION     times two  PARTIAL HYSTERECTOMY     TUBAL LIGATION     WISDOM TOOTH EXTRACTION     Social History   Social History Narrative   3 level home with husband; R handed ; high school grad; 1 cup coffee every am; exercise not much;    Immunization History  Administered Date(s) Administered   [employer] Vaccination 01/29/2020, 02/27/2020, 10/21/2020     Objective: Vital Signs: BP (!) 148/83 (BP Location: Left Arm,  Patient Position: Sitting, Cuff Size: Normal)   Pulse 63   Resp 14   Ht  (1.575 m)   Wt 152 lb 3.2 oz (69 kg)   BMI 27.84 kg/m    Physical Exam Vitals and nursing note reviewed.  Constitutional:      Appearance: She is well-developed.  HENT:     Head: Normocephalic and atraumatic.  Eyes:     Conjunctiva/sclera: Conjunctivae normal.  Cardiovascular:     Rate and Rhythm: Normal rate and regular rhythm.     Heart sounds: Normal heart sounds.  Pulmonary:     Effort: Pulmonary effort is normal.     Breath sounds: Normal breath sounds.  Abdominal:     General: Bowel sounds are normal.     Palpations: Abdomen is soft.  Musculoskeletal:     Cervical back: Normal range of motion.  Skin:    General: Skin is warm and dry.     Capillary Refill: Capillary refill takes less than 2 seconds.  Neurological:     Mental Status: She is alert and oriented to person, place, and time.  Psychiatric:        Behavior: Behavior normal.      Musculoskeletal Exam: C-spine, thoracic spine, and lumbar spine have good range of motion.  Some tenderness over bilateral SI joints.  Shoulder joints, elbow joints, wrist joints, MCPs, PIPs, DIPs have good range of motion with no synovitis.  Complete fist formation bilaterally.  Mild PIP and DIP thickening consistent with osteoarthritis of both hands.  Hip joints have good range of motion with no groin pain.  Some tenderness over the left trochanteric bursa.  Knee joints have good range of motion with no warmth or effusion.  Ankle joints have good range of motion with no tenderness or synovitis.  No tenderness or synovitis over MTP joints.  No evidence of Achilles tendinitis or plantar fasciitis.  CDAI Exam: CDAI Score: -- Patient Global: --; Provider Global: -- Swollen: --; Tender: -- Joint Exam 08/31/2022   No joint exam has been documented for this visit   There is currently no information documented on the homunculus. Go to the Rheumatology activity  and complete the homunculus joint exam.  Investigation: No additional findings.  Imaging: No results found.  Recent Labs: Lab Results  Component Value Date   WBC 8.3 02/28/2021   HGB 11.8 02/28/2021   PLT 353 02/28/2021   NA 140 02/25/2021   K 4.2 02/25/2021   CL 103 02/25/2021   CO2 23 02/25/2021   GLUCOSE 114 (H) 02/25/2021   BUN 19 02/25/2021   CREATININE 1.04 (H) 02/25/2021   BILITOT 0.4 02/25/2021   AST 18 02/25/2021   ALT 9 02/25/2021   PROT 7.1 02/28/2021   CALCIUM 10.0 02/25/2021   GFRAA 65 02/25/2021   QFTBGOLDPLUS NEGATIVE 02/28/2021    Speciality Comments: No specialty comments available.  Procedures:  No procedures performed Allergies: Erythromycin, Flonase [fluticasone], Influenza a (h1n1) monovalent vaccine, Olmesartan, Valsartan, and Penicillins   Assessment / Plan:  Visit Diagnoses: Fibromyalgia: She experiences intermittent myalgias and muscle tenderness due to fibromyalgia.  She has not had any recent fibromyalgia flares but continues to have occasional myalgias, arthralgias, and joint stiffness.  She tries to avoid taking Tylenol and NSAIDs over-the-counter for pain relief.  She continues to take tart cherry and turmeric for the natural anti-inflammatory properties.  Discussed the importance of regular exercise and good sleep hygiene.  She will follow-up in the office in 1 year or sooner if needed.  Chronic right shoulder pain: She has good range of motion of the right shoulder joint on examination today.  No tenderness or discomfort at this time.  Primary osteoarthritis of both hands -She was diagnosed with RA by Dr. Dorathy Kinsman and was treated with prednisone and methotrexate.  She has been off of methotrexate since January 2022.  On examination she has mild PIP and DIP thickening consistent with osteoarthritis of both hands.  No tenderness or synovitis over MCPs or wrist joints.  Complete fist formation noted bilaterally.  No signs of inflammatory  arthritis currently.  She does not require any immunosuppressive agents.  Discussed the importance of joint protection and muscle strengthening.  She will notify us if she develops increased joint pain or joint swelling.  Primary osteoarthritis of both knees - X-ray showed mild osteoarthritis and moderate chondromalacia patella.  She has good range of motion of both knee joints on examination today.  She has occasional discomfort especially when climbing steps.  No warmth or effusion was noted on examination.  Primary osteoarthritis of both feet - X-rays consistent with early osteoarthritis.  Very mild PIP and DIP thickening consistent with osteoarthritis of both feet.  No tenderness or synovitis over MTP joints.  Both ankle joints have good range of motion with no tenderness or synovitis.  No evidence of Achilles tendinitis or plantar fasciitis.  Chronic left SI joint pain - XR unremarkable.  She experiences occasional discomfort and stiffness in her lower back.  She has some tenderness palpation over both SI joints on examination today.  High risk medication use: MTX 2018-Discontinued January 2022.  She was diagnosed with rheumatoid arthritis by Dr. Octaviano Glow in the past and was treated with methotrexate for 4 years.  She does not require immunosuppressive therapy at this time.  Positive ANA (antinuclear antibody): She has no clinical features of systemic lupus at this time. Lab work from 02/25/2021 and 02/28/2021 was reviewed today in the office: ANA 1: 160, nuclear, discrete nuclear dots, 1: 160, nuclear, dense fine speckled, no hypocomplementemia, Smith antibody negative, double-stranded ENA negative, Ro-, La-, Scl-70 negative.  Offered to update autoimmune lab work today but she would like to hold off until her next follow-up visit.  She was advised to notify us if she develops any new or worsening symptoms.  She does not require immunosuppressive therapy at this time.  Osteopenia of multiple sites:  DEXA 07/01/2021 T score -1.4 RFN.  She has been taking a vitamin D supplement daily.  No recent falls or fractures.  Due to update DEXA July 2024.  Vitamin D deficiency: She is taking a vitamin D supplement daily.   Other medical conditions are listed as follows:   Essential hypertension  History of hyperlipidemia  History of chronic kidney disease  History of gastroesophageal reflux (GERD)  History of type 2 diabetes mellitus  Orders: No orders of the defined types were placed in this encounter.  No orders of the defined types were placed in this encounter.    Follow-Up  Instructions: Return in about 1 year (around 09/01/2023).   Ofilia Neas, PA-C  Note - This record has been created using Dragon software.  Chart creation errors have been sought, but may not always  have been located. Such creation errors do not reflect on  the standard of medical care.

## 2022-08-31 ENCOUNTER — Encounter: Payer: Self-pay | Admitting: Physician Assistant

## 2022-08-31 ENCOUNTER — Ambulatory Visit: Payer: Medicare Other | Attending: Physician Assistant | Admitting: Physician Assistant

## 2022-08-31 VITALS — BP 148/83 | HR 63 | Resp 14 | Ht 62.0 in | Wt 152.2 lb

## 2022-08-31 DIAGNOSIS — M19072 Primary osteoarthritis, left ankle and foot: Secondary | ICD-10-CM

## 2022-08-31 DIAGNOSIS — G8929 Other chronic pain: Secondary | ICD-10-CM

## 2022-08-31 DIAGNOSIS — M19042 Primary osteoarthritis, left hand: Secondary | ICD-10-CM

## 2022-08-31 DIAGNOSIS — E559 Vitamin D deficiency, unspecified: Secondary | ICD-10-CM

## 2022-08-31 DIAGNOSIS — Z8639 Personal history of other endocrine, nutritional and metabolic disease: Secondary | ICD-10-CM

## 2022-08-31 DIAGNOSIS — M25511 Pain in right shoulder: Secondary | ICD-10-CM

## 2022-08-31 DIAGNOSIS — R768 Other specified abnormal immunological findings in serum: Secondary | ICD-10-CM

## 2022-08-31 DIAGNOSIS — M170 Bilateral primary osteoarthritis of knee: Secondary | ICD-10-CM

## 2022-08-31 DIAGNOSIS — Z79899 Other long term (current) drug therapy: Secondary | ICD-10-CM

## 2022-08-31 DIAGNOSIS — M19071 Primary osteoarthritis, right ankle and foot: Secondary | ICD-10-CM

## 2022-08-31 DIAGNOSIS — M19041 Primary osteoarthritis, right hand: Secondary | ICD-10-CM

## 2022-08-31 DIAGNOSIS — M797 Fibromyalgia: Secondary | ICD-10-CM

## 2022-08-31 DIAGNOSIS — Z8719 Personal history of other diseases of the digestive system: Secondary | ICD-10-CM

## 2022-08-31 DIAGNOSIS — I10 Essential (primary) hypertension: Secondary | ICD-10-CM

## 2022-08-31 DIAGNOSIS — M533 Sacrococcygeal disorders, not elsewhere classified: Secondary | ICD-10-CM

## 2022-08-31 DIAGNOSIS — M8589 Other specified disorders of bone density and structure, multiple sites: Secondary | ICD-10-CM

## 2022-08-31 DIAGNOSIS — Z87448 Personal history of other diseases of urinary system: Secondary | ICD-10-CM

## 2022-09-19 ENCOUNTER — Other Ambulatory Visit: Payer: Self-pay | Admitting: Internal Medicine

## 2022-09-19 DIAGNOSIS — Z1231 Encounter for screening mammogram for malignant neoplasm of breast: Secondary | ICD-10-CM

## 2022-10-06 DIAGNOSIS — Z1339 Encounter for screening examination for other mental health and behavioral disorders: Secondary | ICD-10-CM | POA: Diagnosis not present

## 2022-10-06 DIAGNOSIS — Z1331 Encounter for screening for depression: Secondary | ICD-10-CM | POA: Diagnosis not present

## 2022-10-06 DIAGNOSIS — Z7189 Other specified counseling: Secondary | ICD-10-CM | POA: Diagnosis not present

## 2022-10-06 DIAGNOSIS — Z0000 Encounter for general adult medical examination without abnormal findings: Secondary | ICD-10-CM | POA: Diagnosis not present

## 2022-10-09 DIAGNOSIS — E7800 Pure hypercholesterolemia, unspecified: Secondary | ICD-10-CM | POA: Diagnosis not present

## 2022-10-09 DIAGNOSIS — Z79899 Other long term (current) drug therapy: Secondary | ICD-10-CM | POA: Diagnosis not present

## 2022-10-09 DIAGNOSIS — E559 Vitamin D deficiency, unspecified: Secondary | ICD-10-CM | POA: Diagnosis not present

## 2022-10-09 DIAGNOSIS — R5383 Other fatigue: Secondary | ICD-10-CM | POA: Diagnosis not present

## 2022-11-13 ENCOUNTER — Ambulatory Visit
Admission: RE | Admit: 2022-11-13 | Discharge: 2022-11-13 | Disposition: A | Discharge status: Home / Self Care | Payer: Medicare Other | Source: Ambulatory Visit

## 2022-11-13 DIAGNOSIS — Z1231 Encounter for screening mammogram for malignant neoplasm of breast: Secondary | ICD-10-CM

## 2022-12-20 DIAGNOSIS — H3540 Unspecified peripheral retinal degeneration: Secondary | ICD-10-CM | POA: Diagnosis not present

## 2022-12-28 DIAGNOSIS — K080 Exfoliation of teeth due to systemic causes: Secondary | ICD-10-CM | POA: Diagnosis not present

## 2023-01-09 DIAGNOSIS — R809 Proteinuria, unspecified: Secondary | ICD-10-CM | POA: Diagnosis not present

## 2023-01-09 DIAGNOSIS — E1165 Type 2 diabetes mellitus with hyperglycemia: Secondary | ICD-10-CM | POA: Diagnosis not present

## 2023-01-09 DIAGNOSIS — E1122 Type 2 diabetes mellitus with diabetic chronic kidney disease: Secondary | ICD-10-CM | POA: Diagnosis not present

## 2023-01-09 DIAGNOSIS — I10 Essential (primary) hypertension: Secondary | ICD-10-CM | POA: Diagnosis not present

## 2023-01-09 DIAGNOSIS — E1129 Type 2 diabetes mellitus with other diabetic kidney complication: Secondary | ICD-10-CM | POA: Diagnosis not present

## 2023-01-09 DIAGNOSIS — Z299 Encounter for prophylactic measures, unspecified: Secondary | ICD-10-CM | POA: Diagnosis not present

## 2023-01-09 DIAGNOSIS — Z6828 Body mass index (BMI) 28.0-28.9, adult: Secondary | ICD-10-CM | POA: Diagnosis not present

## 2023-02-28 DIAGNOSIS — E1165 Type 2 diabetes mellitus with hyperglycemia: Secondary | ICD-10-CM | POA: Diagnosis not present

## 2023-02-28 DIAGNOSIS — Z6827 Body mass index (BMI) 27.0-27.9, adult: Secondary | ICD-10-CM | POA: Diagnosis not present

## 2023-02-28 DIAGNOSIS — M542 Cervicalgia: Secondary | ICD-10-CM | POA: Diagnosis not present

## 2023-02-28 DIAGNOSIS — D899 Disorder involving the immune mechanism, unspecified: Secondary | ICD-10-CM | POA: Diagnosis not present

## 2023-02-28 DIAGNOSIS — Z299 Encounter for prophylactic measures, unspecified: Secondary | ICD-10-CM | POA: Diagnosis not present

## 2023-02-28 DIAGNOSIS — I10 Essential (primary) hypertension: Secondary | ICD-10-CM | POA: Diagnosis not present

## 2023-05-11 DIAGNOSIS — Z299 Encounter for prophylactic measures, unspecified: Secondary | ICD-10-CM | POA: Diagnosis not present

## 2023-05-11 DIAGNOSIS — M069 Rheumatoid arthritis, unspecified: Secondary | ICD-10-CM | POA: Diagnosis not present

## 2023-05-11 DIAGNOSIS — I10 Essential (primary) hypertension: Secondary | ICD-10-CM | POA: Diagnosis not present

## 2023-05-11 DIAGNOSIS — N183 Chronic kidney disease, stage 3 unspecified: Secondary | ICD-10-CM | POA: Diagnosis not present

## 2023-05-11 DIAGNOSIS — E1122 Type 2 diabetes mellitus with diabetic chronic kidney disease: Secondary | ICD-10-CM | POA: Diagnosis not present

## 2023-05-11 DIAGNOSIS — I471 Supraventricular tachycardia, unspecified: Secondary | ICD-10-CM | POA: Diagnosis not present

## 2023-05-11 DIAGNOSIS — E1165 Type 2 diabetes mellitus with hyperglycemia: Secondary | ICD-10-CM | POA: Diagnosis not present

## 2023-05-28 DIAGNOSIS — K080 Exfoliation of teeth due to systemic causes: Secondary | ICD-10-CM | POA: Diagnosis not present

## 2023-05-31 DIAGNOSIS — K080 Exfoliation of teeth due to systemic causes: Secondary | ICD-10-CM | POA: Diagnosis not present

## 2023-06-19 DIAGNOSIS — K080 Exfoliation of teeth due to systemic causes: Secondary | ICD-10-CM | POA: Diagnosis not present

## 2023-06-22 DIAGNOSIS — I10 Essential (primary) hypertension: Secondary | ICD-10-CM | POA: Diagnosis not present

## 2023-06-22 DIAGNOSIS — E1165 Type 2 diabetes mellitus with hyperglycemia: Secondary | ICD-10-CM | POA: Diagnosis not present

## 2023-06-22 DIAGNOSIS — Z299 Encounter for prophylactic measures, unspecified: Secondary | ICD-10-CM | POA: Diagnosis not present

## 2023-07-11 DIAGNOSIS — K080 Exfoliation of teeth due to systemic causes: Secondary | ICD-10-CM | POA: Diagnosis not present

## 2023-07-30 DIAGNOSIS — M069 Rheumatoid arthritis, unspecified: Secondary | ICD-10-CM | POA: Diagnosis not present

## 2023-07-30 DIAGNOSIS — E1165 Type 2 diabetes mellitus with hyperglycemia: Secondary | ICD-10-CM | POA: Diagnosis not present

## 2023-07-30 DIAGNOSIS — I10 Essential (primary) hypertension: Secondary | ICD-10-CM | POA: Diagnosis not present

## 2023-07-30 DIAGNOSIS — K219 Gastro-esophageal reflux disease without esophagitis: Secondary | ICD-10-CM | POA: Diagnosis not present

## 2023-08-09 DIAGNOSIS — K080 Exfoliation of teeth due to systemic causes: Secondary | ICD-10-CM | POA: Diagnosis not present

## 2023-08-17 NOTE — Progress Notes (Signed)
Office Visit Note  Patient: Betty Wright             Date of Birth: February 28, 1956           MRN: [id number]             PCP: Donetta Potts, MD Referring: Kirstie Peri, MD Visit Date: 08/30/2023 Occupation: @GUAROCC @  Subjective:  Joint stiffness  History of Present Illness: Betty Wright is a 67 y.o. female with osteoarthritis and fibromyalgia.  As she states she continues to have mild stiffness in her joints but no significant joint pain.  She does not have much discomfort in her hands knees or feet.  She continues to have some generalized achiness from fibromyalgia.  She states fibromyalgia symptoms are manageable.  She has been taking natural anti-inflammatories which has been helpful.    Activities of Daily Living:  Patient reports morning stiffness for 1 hour.   Patient Reports nocturnal pain.  Difficulty dressing/grooming: Denies Difficulty climbing stairs: Denies Difficulty getting out of chair: Denies Difficulty using hands for taps, buttons, cutlery, and/or writing: Denies  Review of Systems  Constitutional:  Positive for fatigue.  HENT:  Negative for mouth sores and mouth dryness.   Eyes:  Positive for dryness.  Respiratory:  Negative for shortness of breath.   Cardiovascular:  Negative for chest pain and palpitations.  Gastrointestinal:  Positive for diarrhea. Negative for blood in stool and constipation.  Endocrine: Negative for increased urination.  Genitourinary:  Negative for involuntary urination.  Musculoskeletal:  Positive for joint pain, joint pain and morning stiffness. Negative for gait problem, joint swelling, myalgias, muscle weakness, muscle tenderness and myalgias.  Skin:  Positive for sensitivity to sunlight. Negative for color change, rash and hair loss.  Allergic/Immunologic: Negative for susceptible to infections.  Neurological:  Negative for dizziness and headaches.  Hematological:  Negative for swollen glands.   Psychiatric/Behavioral:  Positive for sleep disturbance. Negative for depressed mood. The patient is not nervous/anxious.     PMFS History:  Patient Active Problem List   Diagnosis Date Noted   Primary osteoarthritis of both hands 07/19/2021   Primary osteoarthritis of both feet 07/19/2021   Primary osteoarthritis of both knees 07/19/2021   Chronic left SI joint pain 07/19/2021   Fibromyalgia 07/19/2021   Vitamin D deficiency 07/19/2021   History of hyperlipidemia 07/19/2021   Essential hypertension 07/19/2021   History of gastroesophageal reflux (GERD) 07/19/2021   History of type 2 diabetes mellitus 07/19/2021   History of chronic kidney disease 07/19/2021   Osteopenia 07/19/2021    Past Medical History:  Diagnosis Date   Diabetes (HCC)    Fibromyalgia    Hypertension    Osteopenia    Rheumatoid arthritis (HCC)     Family History  Adopted: Yes  Problem Relation Age of Onset   Healthy Daughter    Healthy Son    Breast cancer Neg Hx    Past Surgical History:  Procedure Laterality Date   BREAST EXCISIONAL BIOPSY Left    benign   BREAST SURGERY Left    lumpectomy    CESAREAN SECTION     times two   PARTIAL HYSTERECTOMY     TUBAL LIGATION     WISDOM TOOTH EXTRACTION     Social History   Social History Narrative   3 level home with husband; R handed ; high school grad; 1 cup coffee every am; exercise not much;    Immunization History  Administered Date(s) Administered  Moderna Sars-Covid-2 Vaccination 01/29/2020, 02/27/2020, 10/21/2020   Pneumococcal-Unspecified 08/29/2023     Objective: Vital Signs: BP (!) 143/84 (BP Location: Left Arm, Patient Position: Sitting, Cuff Size: Normal)   Pulse 78   Resp 15   Ht 5\' 2"  (1.575 m)   Wt 161 lb 3.2 oz (73.1 kg)   BMI 29.48 kg/m    Physical Exam Vitals and nursing note reviewed.  Constitutional:      Appearance: She is well-developed.  HENT:     Head: Normocephalic and atraumatic.  Eyes:      Conjunctiva/sclera: Conjunctivae normal.  Cardiovascular:     Rate and Rhythm: Normal rate and regular rhythm.     Heart sounds: Normal heart sounds.  Pulmonary:     Effort: Pulmonary effort is normal.     Breath sounds: Normal breath sounds.  Abdominal:     General: Bowel sounds are normal.     Palpations: Abdomen is soft.  Musculoskeletal:     Cervical back: Normal range of motion.  Lymphadenopathy:     Cervical: No cervical adenopathy.  Skin:    General: Skin is warm and dry.     Capillary Refill: Capillary refill takes less than 2 seconds.  Neurological:     Mental Status: She is alert and oriented to person, place, and time.  Psychiatric:        Behavior: Behavior normal.      Musculoskeletal Exam: Cervical, thoracic and lumbar spine 1 good range of motion.  Shoulder joints, elbow joints, wrist joints, MCPs PIPs and DIPs with good range of motion.  She had bilateral CMC PIP and DIP thickening with no synovitis.  Hip joints and knee joints in good range of motion without any warmth swelling or effusion.  There was no tenderness over ankles or MTPs.  CDAI Exam: CDAI Score: -- Patient Global: --; Provider Global: -- Swollen: --; Tender: -- Joint Exam 08/30/2023   No joint exam has been documented for this visit   There is currently no information documented on the homunculus. Go to the Rheumatology activity and complete the homunculus joint exam.  Investigation: No additional findings.  Imaging: No results found.  Recent Labs: Lab Results  Component Value Date   WBC 8.3 02/28/2021   HGB 11.8 02/28/2021   PLT 353 02/28/2021   NA 140 02/25/2021   K 4.2 02/25/2021   CL 103 02/25/2021   CO2 23 02/25/2021   GLUCOSE 114 (H) 02/25/2021   BUN 19 02/25/2021   CREATININE 1.04 (H) 02/25/2021   BILITOT 0.4 02/25/2021   AST 18 02/25/2021   ALT 9 02/25/2021   PROT 7.1 02/28/2021   CALCIUM 10.0 02/25/2021   GFRAA 65 02/25/2021   QFTBGOLDPLUS NEGATIVE 02/28/2021    August 22, 2023 CBC WBC 8.0, hemoglobin 10.6, platelets 348, CMP glucose 157, creatinine 1.2, GFR 49.4, TSH 4.6, vitamin D45.7, hemoglobin A1c 7.5 Speciality Comments: No specialty comments available.  Procedures:  No procedures performed Allergies: Erythromycin, Flonase [fluticasone], Influenza a (h1n1) monovalent vaccine, Olmesartan, Valsartan, and Penicillins   Assessment / Plan:     Visit Diagnoses: Fibromyalgia-she continues to have some generalized pain and discomfort in her muscles.  She states the pain is manageable with stretching and over-the-counter medications as needed.  She has been avoiding NSAIDs due to low GFR.  She takes natural and inflammatories.  Need for regular exercise and stretching was emphasized.  Chronic right shoulder pain-resolved.  Primary osteoarthritis of both hands -she has bilateral PIP and DIP thickening with  no synovitis.  She was diagnosed with RA by Dr. Dorathy Kinsman and was treated with prednisone and methotrexate in 2018.  She was on methotrexate for 4 years.  Primary osteoarthritis of both knees -she is currently not having much discomfort.  Her need for regular exercise was discussed.  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  Primary osteoarthritis of both feet -she had no tenderness or synovitis.  X-rays consistent with early osteoarthritis.  Chronic left SI joint pain -doing better.  XR unremarkable.  Positive ANA (antinuclear antibody) - Labs 02/25/2021 and 02/28/2021: ANA 1: 160, nuclear, discrete nuclear dots, 1: 160, nuclear, dense fine speckled.  She has no clinical features of lupus.  Osteopenia of multiple sites - DEXA 07/01/2021 T score -1.4 RFN.  Calcium rich diet vitamin D was discussed.  She should have repeat DEXA scan  Vitamin D deficiency  Essential hypertension-blood pressure was elevated at 143/84.  She was advised to monitor blood pressure closely and follow-up with the PCP.  History of chronic  kidney disease-creatinine was elevated at 1.2 and GFR 49.4.  She is followed by her PCP.  She is also on diuretics.  Microcytic anemia-recent labs brought by patient from September 2024 showed microcytic anemia with hemoglobin of 10.6.  Patient scheduled to have colonoscopy the patient.  History of type 2 diabetes mellitus-hemoglobin A1c was elevated at 7.5.  History of hyperlipidemia  History of gastroesophageal reflux (GERD)  Orders: No orders of the defined types were placed in this encounter.  No orders of the defined types were placed in this encounter.    Follow-Up Instructions: Return in about 1 year (around 08/29/2024) for Osteoarthritis.   Pollyann Savoy, MD  Note - This record has been created using Animal nutritionist.  Chart creation errors have been sought, but may not always  have been located. Such creation errors do not reflect on  the standard of medical care.

## 2023-08-22 DIAGNOSIS — Z1329 Encounter for screening for other suspected endocrine disorder: Secondary | ICD-10-CM | POA: Diagnosis not present

## 2023-08-22 DIAGNOSIS — Z1321 Encounter for screening for nutritional disorder: Secondary | ICD-10-CM | POA: Diagnosis not present

## 2023-08-22 DIAGNOSIS — K219 Gastro-esophageal reflux disease without esophagitis: Secondary | ICD-10-CM | POA: Diagnosis not present

## 2023-08-22 DIAGNOSIS — Z0000 Encounter for general adult medical examination without abnormal findings: Secondary | ICD-10-CM | POA: Diagnosis not present

## 2023-08-22 DIAGNOSIS — I10 Essential (primary) hypertension: Secondary | ICD-10-CM | POA: Diagnosis not present

## 2023-08-22 DIAGNOSIS — Z131 Encounter for screening for diabetes mellitus: Secondary | ICD-10-CM | POA: Diagnosis not present

## 2023-08-22 LAB — LAB REPORT - SCANNED
A1c: 7.5
EGFR: 49.4

## 2023-08-29 DIAGNOSIS — I10 Essential (primary) hypertension: Secondary | ICD-10-CM | POA: Diagnosis not present

## 2023-08-29 DIAGNOSIS — K219 Gastro-esophageal reflux disease without esophagitis: Secondary | ICD-10-CM | POA: Diagnosis not present

## 2023-08-29 DIAGNOSIS — M069 Rheumatoid arthritis, unspecified: Secondary | ICD-10-CM | POA: Diagnosis not present

## 2023-08-29 DIAGNOSIS — E1165 Type 2 diabetes mellitus with hyperglycemia: Secondary | ICD-10-CM | POA: Diagnosis not present

## 2023-08-29 DIAGNOSIS — Z23 Encounter for immunization: Secondary | ICD-10-CM | POA: Diagnosis not present

## 2023-08-29 DIAGNOSIS — Z0001 Encounter for general adult medical examination with abnormal findings: Secondary | ICD-10-CM | POA: Diagnosis not present

## 2023-08-30 ENCOUNTER — Encounter: Payer: Self-pay | Admitting: Rheumatology

## 2023-08-30 ENCOUNTER — Ambulatory Visit: Payer: Medicare Other | Attending: Rheumatology | Admitting: Rheumatology

## 2023-08-30 VITALS — BP 143/84 | HR 78 | Resp 15 | Ht 62.0 in | Wt 161.2 lb

## 2023-08-30 DIAGNOSIS — Z79899 Other long term (current) drug therapy: Secondary | ICD-10-CM

## 2023-08-30 DIAGNOSIS — M533 Sacrococcygeal disorders, not elsewhere classified: Secondary | ICD-10-CM

## 2023-08-30 DIAGNOSIS — R768 Other specified abnormal immunological findings in serum: Secondary | ICD-10-CM

## 2023-08-30 DIAGNOSIS — G8929 Other chronic pain: Secondary | ICD-10-CM

## 2023-08-30 DIAGNOSIS — M8589 Other specified disorders of bone density and structure, multiple sites: Secondary | ICD-10-CM

## 2023-08-30 DIAGNOSIS — M19071 Primary osteoarthritis, right ankle and foot: Secondary | ICD-10-CM | POA: Diagnosis not present

## 2023-08-30 DIAGNOSIS — I10 Essential (primary) hypertension: Secondary | ICD-10-CM

## 2023-08-30 DIAGNOSIS — M797 Fibromyalgia: Secondary | ICD-10-CM

## 2023-08-30 DIAGNOSIS — Z87448 Personal history of other diseases of urinary system: Secondary | ICD-10-CM

## 2023-08-30 DIAGNOSIS — M19041 Primary osteoarthritis, right hand: Secondary | ICD-10-CM

## 2023-08-30 DIAGNOSIS — M19072 Primary osteoarthritis, left ankle and foot: Secondary | ICD-10-CM

## 2023-08-30 DIAGNOSIS — Z8639 Personal history of other endocrine, nutritional and metabolic disease: Secondary | ICD-10-CM

## 2023-08-30 DIAGNOSIS — E559 Vitamin D deficiency, unspecified: Secondary | ICD-10-CM

## 2023-08-30 DIAGNOSIS — D509 Iron deficiency anemia, unspecified: Secondary | ICD-10-CM

## 2023-08-30 DIAGNOSIS — M170 Bilateral primary osteoarthritis of knee: Secondary | ICD-10-CM

## 2023-08-30 DIAGNOSIS — M19042 Primary osteoarthritis, left hand: Secondary | ICD-10-CM

## 2023-08-30 DIAGNOSIS — Z8719 Personal history of other diseases of the digestive system: Secondary | ICD-10-CM

## 2023-08-31 DIAGNOSIS — D519 Vitamin B12 deficiency anemia, unspecified: Secondary | ICD-10-CM | POA: Diagnosis not present

## 2023-08-31 DIAGNOSIS — D529 Folate deficiency anemia, unspecified: Secondary | ICD-10-CM | POA: Diagnosis not present

## 2023-08-31 DIAGNOSIS — D649 Anemia, unspecified: Secondary | ICD-10-CM | POA: Diagnosis not present

## 2023-08-31 DIAGNOSIS — E1165 Type 2 diabetes mellitus with hyperglycemia: Secondary | ICD-10-CM | POA: Diagnosis not present

## 2023-09-05 DIAGNOSIS — D500 Iron deficiency anemia secondary to blood loss (chronic): Secondary | ICD-10-CM | POA: Diagnosis not present

## 2023-09-13 DIAGNOSIS — M9901 Segmental and somatic dysfunction of cervical region: Secondary | ICD-10-CM | POA: Diagnosis not present

## 2023-09-13 DIAGNOSIS — M9902 Segmental and somatic dysfunction of thoracic region: Secondary | ICD-10-CM | POA: Diagnosis not present

## 2023-09-13 DIAGNOSIS — S134XXA Sprain of ligaments of cervical spine, initial encounter: Secondary | ICD-10-CM | POA: Diagnosis not present

## 2023-09-13 DIAGNOSIS — S233XXA Sprain of ligaments of thoracic spine, initial encounter: Secondary | ICD-10-CM | POA: Diagnosis not present

## 2023-09-14 DIAGNOSIS — S233XXA Sprain of ligaments of thoracic spine, initial encounter: Secondary | ICD-10-CM | POA: Diagnosis not present

## 2023-09-14 DIAGNOSIS — I10 Essential (primary) hypertension: Secondary | ICD-10-CM | POA: Diagnosis not present

## 2023-09-14 DIAGNOSIS — M9902 Segmental and somatic dysfunction of thoracic region: Secondary | ICD-10-CM | POA: Diagnosis not present

## 2023-09-14 DIAGNOSIS — E1165 Type 2 diabetes mellitus with hyperglycemia: Secondary | ICD-10-CM | POA: Diagnosis not present

## 2023-09-14 DIAGNOSIS — M069 Rheumatoid arthritis, unspecified: Secondary | ICD-10-CM | POA: Diagnosis not present

## 2023-09-14 DIAGNOSIS — M9901 Segmental and somatic dysfunction of cervical region: Secondary | ICD-10-CM | POA: Diagnosis not present

## 2023-09-14 DIAGNOSIS — S134XXA Sprain of ligaments of cervical spine, initial encounter: Secondary | ICD-10-CM | POA: Diagnosis not present

## 2023-09-14 DIAGNOSIS — K219 Gastro-esophageal reflux disease without esophagitis: Secondary | ICD-10-CM | POA: Diagnosis not present

## 2023-09-19 DIAGNOSIS — M9901 Segmental and somatic dysfunction of cervical region: Secondary | ICD-10-CM | POA: Diagnosis not present

## 2023-09-19 DIAGNOSIS — S233XXA Sprain of ligaments of thoracic spine, initial encounter: Secondary | ICD-10-CM | POA: Diagnosis not present

## 2023-09-19 DIAGNOSIS — M9902 Segmental and somatic dysfunction of thoracic region: Secondary | ICD-10-CM | POA: Diagnosis not present

## 2023-09-19 DIAGNOSIS — S134XXA Sprain of ligaments of cervical spine, initial encounter: Secondary | ICD-10-CM | POA: Diagnosis not present

## 2023-09-26 DIAGNOSIS — M9901 Segmental and somatic dysfunction of cervical region: Secondary | ICD-10-CM | POA: Diagnosis not present

## 2023-09-26 DIAGNOSIS — M9902 Segmental and somatic dysfunction of thoracic region: Secondary | ICD-10-CM | POA: Diagnosis not present

## 2023-09-26 DIAGNOSIS — S134XXA Sprain of ligaments of cervical spine, initial encounter: Secondary | ICD-10-CM | POA: Diagnosis not present

## 2023-09-26 DIAGNOSIS — S233XXA Sprain of ligaments of thoracic spine, initial encounter: Secondary | ICD-10-CM | POA: Diagnosis not present

## 2023-10-01 DIAGNOSIS — S233XXA Sprain of ligaments of thoracic spine, initial encounter: Secondary | ICD-10-CM | POA: Diagnosis not present

## 2023-10-01 DIAGNOSIS — M9901 Segmental and somatic dysfunction of cervical region: Secondary | ICD-10-CM | POA: Diagnosis not present

## 2023-10-01 DIAGNOSIS — S134XXA Sprain of ligaments of cervical spine, initial encounter: Secondary | ICD-10-CM | POA: Diagnosis not present

## 2023-10-01 DIAGNOSIS — M9902 Segmental and somatic dysfunction of thoracic region: Secondary | ICD-10-CM | POA: Diagnosis not present

## 2023-10-08 DIAGNOSIS — M9902 Segmental and somatic dysfunction of thoracic region: Secondary | ICD-10-CM | POA: Diagnosis not present

## 2023-10-08 DIAGNOSIS — S233XXA Sprain of ligaments of thoracic spine, initial encounter: Secondary | ICD-10-CM | POA: Diagnosis not present

## 2023-10-08 DIAGNOSIS — M9901 Segmental and somatic dysfunction of cervical region: Secondary | ICD-10-CM | POA: Diagnosis not present

## 2023-10-08 DIAGNOSIS — S134XXA Sprain of ligaments of cervical spine, initial encounter: Secondary | ICD-10-CM | POA: Diagnosis not present

## 2023-10-17 DIAGNOSIS — M9901 Segmental and somatic dysfunction of cervical region: Secondary | ICD-10-CM | POA: Diagnosis not present

## 2023-10-17 DIAGNOSIS — S134XXA Sprain of ligaments of cervical spine, initial encounter: Secondary | ICD-10-CM | POA: Diagnosis not present

## 2023-10-17 DIAGNOSIS — S233XXA Sprain of ligaments of thoracic spine, initial encounter: Secondary | ICD-10-CM | POA: Diagnosis not present

## 2023-10-17 DIAGNOSIS — M9902 Segmental and somatic dysfunction of thoracic region: Secondary | ICD-10-CM | POA: Diagnosis not present

## 2023-10-22 ENCOUNTER — Other Ambulatory Visit: Payer: Self-pay | Admitting: Internal Medicine

## 2023-10-22 DIAGNOSIS — Z1231 Encounter for screening mammogram for malignant neoplasm of breast: Secondary | ICD-10-CM

## 2023-10-23 DIAGNOSIS — E039 Hypothyroidism, unspecified: Secondary | ICD-10-CM | POA: Diagnosis not present

## 2023-10-23 LAB — TSH: TSH: 3.19 (ref 0.41–5.90)

## 2023-10-26 DIAGNOSIS — M9901 Segmental and somatic dysfunction of cervical region: Secondary | ICD-10-CM | POA: Diagnosis not present

## 2023-10-26 DIAGNOSIS — S134XXA Sprain of ligaments of cervical spine, initial encounter: Secondary | ICD-10-CM | POA: Diagnosis not present

## 2023-10-26 DIAGNOSIS — S233XXA Sprain of ligaments of thoracic spine, initial encounter: Secondary | ICD-10-CM | POA: Diagnosis not present

## 2023-10-26 DIAGNOSIS — M9902 Segmental and somatic dysfunction of thoracic region: Secondary | ICD-10-CM | POA: Diagnosis not present

## 2023-11-05 DIAGNOSIS — S233XXA Sprain of ligaments of thoracic spine, initial encounter: Secondary | ICD-10-CM | POA: Diagnosis not present

## 2023-11-05 DIAGNOSIS — S134XXA Sprain of ligaments of cervical spine, initial encounter: Secondary | ICD-10-CM | POA: Diagnosis not present

## 2023-11-05 DIAGNOSIS — M9902 Segmental and somatic dysfunction of thoracic region: Secondary | ICD-10-CM | POA: Diagnosis not present

## 2023-11-05 DIAGNOSIS — M9901 Segmental and somatic dysfunction of cervical region: Secondary | ICD-10-CM | POA: Diagnosis not present

## 2023-11-15 DIAGNOSIS — Z90710 Acquired absence of both cervix and uterus: Secondary | ICD-10-CM | POA: Diagnosis not present

## 2023-11-15 DIAGNOSIS — K219 Gastro-esophageal reflux disease without esophagitis: Secondary | ICD-10-CM | POA: Diagnosis not present

## 2023-11-15 DIAGNOSIS — M797 Fibromyalgia: Secondary | ICD-10-CM | POA: Diagnosis not present

## 2023-11-15 DIAGNOSIS — K2950 Unspecified chronic gastritis without bleeding: Secondary | ICD-10-CM | POA: Diagnosis not present

## 2023-11-15 DIAGNOSIS — I10 Essential (primary) hypertension: Secondary | ICD-10-CM | POA: Diagnosis not present

## 2023-11-15 DIAGNOSIS — K317 Polyp of stomach and duodenum: Secondary | ICD-10-CM | POA: Diagnosis not present

## 2023-11-15 DIAGNOSIS — Z79899 Other long term (current) drug therapy: Secondary | ICD-10-CM | POA: Diagnosis not present

## 2023-11-15 DIAGNOSIS — Z7984 Long term (current) use of oral hypoglycemic drugs: Secondary | ICD-10-CM | POA: Diagnosis not present

## 2023-11-15 DIAGNOSIS — E119 Type 2 diabetes mellitus without complications: Secondary | ICD-10-CM | POA: Diagnosis not present

## 2023-11-15 DIAGNOSIS — D649 Anemia, unspecified: Secondary | ICD-10-CM | POA: Diagnosis not present

## 2023-11-15 DIAGNOSIS — D509 Iron deficiency anemia, unspecified: Secondary | ICD-10-CM | POA: Diagnosis not present

## 2023-11-19 ENCOUNTER — Ambulatory Visit
Admission: RE | Admit: 2023-11-19 | Discharge: 2023-11-19 | Disposition: A | Discharge status: Home / Self Care | Payer: Medicare Other | Source: Ambulatory Visit | Attending: Internal Medicine | Admitting: Internal Medicine

## 2023-11-19 DIAGNOSIS — Z1231 Encounter for screening mammogram for malignant neoplasm of breast: Secondary | ICD-10-CM

## 2023-11-26 DIAGNOSIS — Z20828 Contact with and (suspected) exposure to other viral communicable diseases: Secondary | ICD-10-CM | POA: Diagnosis not present

## 2023-11-26 DIAGNOSIS — J019 Acute sinusitis, unspecified: Secondary | ICD-10-CM | POA: Diagnosis not present

## 2023-11-26 DIAGNOSIS — J111 Influenza due to unidentified influenza virus with other respiratory manifestations: Secondary | ICD-10-CM | POA: Diagnosis not present

## 2023-12-18 ENCOUNTER — Encounter (INDEPENDENT_AMBULATORY_CARE_PROVIDER_SITE_OTHER): Payer: Self-pay | Admitting: *Deleted

## 2023-12-19 DIAGNOSIS — Z13220 Encounter for screening for lipoid disorders: Secondary | ICD-10-CM | POA: Diagnosis not present

## 2023-12-19 DIAGNOSIS — I10 Essential (primary) hypertension: Secondary | ICD-10-CM | POA: Diagnosis not present

## 2023-12-19 DIAGNOSIS — D649 Anemia, unspecified: Secondary | ICD-10-CM | POA: Diagnosis not present

## 2023-12-19 DIAGNOSIS — K219 Gastro-esophageal reflux disease without esophagitis: Secondary | ICD-10-CM | POA: Diagnosis not present

## 2023-12-19 DIAGNOSIS — E1165 Type 2 diabetes mellitus with hyperglycemia: Secondary | ICD-10-CM | POA: Diagnosis not present

## 2023-12-19 LAB — HEMOGLOBIN A1C: Hemoglobin A1C: 7.7

## 2023-12-19 LAB — LIPID PANEL
LDL Cholesterol: 131
Triglycerides: 170 — AB (ref 40–160)

## 2023-12-19 LAB — BASIC METABOLIC PANEL WITH GFR
BUN: 14 (ref 4–21)
Creatinine: 1.1 (ref 0.5–1.1)
Glucose: 176

## 2023-12-19 LAB — COMPREHENSIVE METABOLIC PANEL WITH GFR: eGFR: 56.5

## 2023-12-25 DIAGNOSIS — E1165 Type 2 diabetes mellitus with hyperglycemia: Secondary | ICD-10-CM | POA: Diagnosis not present

## 2023-12-25 DIAGNOSIS — Z6829 Body mass index (BMI) 29.0-29.9, adult: Secondary | ICD-10-CM | POA: Diagnosis not present

## 2023-12-25 DIAGNOSIS — D649 Anemia, unspecified: Secondary | ICD-10-CM | POA: Diagnosis not present

## 2023-12-25 DIAGNOSIS — M069 Rheumatoid arthritis, unspecified: Secondary | ICD-10-CM | POA: Diagnosis not present

## 2023-12-25 DIAGNOSIS — I10 Essential (primary) hypertension: Secondary | ICD-10-CM | POA: Diagnosis not present

## 2024-01-02 DIAGNOSIS — H3540 Unspecified peripheral retinal degeneration: Secondary | ICD-10-CM | POA: Diagnosis not present

## 2024-01-07 DIAGNOSIS — Z6829 Body mass index (BMI) 29.0-29.9, adult: Secondary | ICD-10-CM | POA: Diagnosis not present

## 2024-01-07 DIAGNOSIS — M069 Rheumatoid arthritis, unspecified: Secondary | ICD-10-CM | POA: Diagnosis not present

## 2024-01-07 DIAGNOSIS — I10 Essential (primary) hypertension: Secondary | ICD-10-CM | POA: Diagnosis not present

## 2024-01-07 DIAGNOSIS — E1165 Type 2 diabetes mellitus with hyperglycemia: Secondary | ICD-10-CM | POA: Diagnosis not present

## 2024-01-07 DIAGNOSIS — K219 Gastro-esophageal reflux disease without esophagitis: Secondary | ICD-10-CM | POA: Diagnosis not present

## 2024-01-09 ENCOUNTER — Ambulatory Visit (INDEPENDENT_AMBULATORY_CARE_PROVIDER_SITE_OTHER): Payer: Medicare Other | Admitting: Gastroenterology

## 2024-01-09 ENCOUNTER — Encounter (INDEPENDENT_AMBULATORY_CARE_PROVIDER_SITE_OTHER): Payer: Self-pay | Admitting: Gastroenterology

## 2024-01-09 VITALS — BP 138/66 | HR 77 | Temp 97.3°F | Ht 62.0 in | Wt 156.4 lb

## 2024-01-09 DIAGNOSIS — E538 Deficiency of other specified B group vitamins: Secondary | ICD-10-CM | POA: Diagnosis not present

## 2024-01-09 DIAGNOSIS — D509 Iron deficiency anemia, unspecified: Secondary | ICD-10-CM | POA: Diagnosis not present

## 2024-01-09 DIAGNOSIS — K317 Polyp of stomach and duodenum: Secondary | ICD-10-CM | POA: Insufficient documentation

## 2024-01-09 DIAGNOSIS — D500 Iron deficiency anemia secondary to blood loss (chronic): Secondary | ICD-10-CM | POA: Insufficient documentation

## 2024-01-09 NOTE — H&P (View-Only) (Signed)
 Betty Wright , M.D. Gastroenterology & Hepatology [facility] [address] [place], [place] [address] Primary Care Physician: Trudy Vaughn FALCON, MD [address] [place] [address]  Chief Complaint:  iron deficiency anemia, vitamin B12 deficiency and multiple hyperplastic gastric polyps  History of Present Illness: Betty Wright is a 68 y.o. female with rheumatoid arthritis, fibromyalgia, diabetes on metformin  who presents for evaluation of iron deficiency anemia, vitamin B12 deficiency and multiple hyperplastic gastric polyps  Patient is accompanied with husband in the clinic.  Patient reports intermittent diarrhea and attributes it to taking metformin  The patient denies having any nausea, vomiting, fever, chills, hematochezia, melena, hematemesis, abdominal distention, abdominal pain,  jaundice, pruritus or weight loss.  Last EGD: 11/15/2023 follow-up EGD performed by Dr. Donny    Path:  Hyperplastic polyps Negative for intestinal metaplasia dysplasia or malignancy  Last Colonoscopy:11/2023    Suggested repeat 10 years  FHx: UNKNOWN as patient was adopted  Social: neg smoking, alcohol  or illicit drug use Surgical: Partial hysterectomy  Negative gastric urease test for H. pylori 11/15/2023  Labs from 08/2023 iron saturation 7% ferritin 19 vitamin B12 312 (low)  folate more than 20 Hemoglobin 10.8 MCV 77 Past Medical History: Past Medical History:  Diagnosis Date   Diabetes (HCC)    Fibromyalgia    Hypertension    Osteopenia    Rheumatoid arthritis (HCC)     Past Surgical History: Past Surgical History:  Procedure Laterality Date   BREAST EXCISIONAL BIOPSY Left    benign   BREAST SURGERY Left    lumpectomy    CESAREAN SECTION     times two   PARTIAL HYSTERECTOMY     TUBAL LIGATION     WISDOM TOOTH EXTRACTION      Family History: Family History  Adopted: Yes  Problem Relation Age of Onset    Healthy Daughter    Healthy Son    Breast cancer Neg Hx     Social History: Social History   Tobacco Use  Smoking Status Never   Passive exposure: Past  Smokeless Tobacco Never   Social History   Substance and Sexual Activity  Alcohol  Use Never   Social History   Substance and Sexual Activity  Drug Use Never    Allergies: Allergies  Allergen Reactions   Erythromycin    Flonase [Fluticasone]    Influenza A (H1n1) Monovalent Vaccine     She wasn't sure which one but cant take the flu shot - arm became swollen.   Olmesartan     Muscle spasms   Valsartan     Muscle spasms   Penicillins Rash    Medications: Current Outpatient Medications  Medication Sig Dispense Refill   amLODipine (NORVASC) 5 MG tablet Take 5 mg by mouth daily.     glipiZIDE  (GLUCOTROL  XL) 10 MG 24 hr tablet Take 10 mg by mouth daily.     lansoprazole (PREVACID) 30 MG capsule Take 30 mg by mouth daily at 12 noon. Pt unsure of dose     levocetirizine (XYZAL) 5 MG tablet Take 5 mg by mouth every evening.     metFORMIN  (GLUCOPHAGE -XR) 500 MG 24 hr tablet Take 500 mg by mouth 2 (two) times daily.     metoprolol succinate (TOPROL-XL) 25 MG 24 hr tablet Take 25 mg by mouth daily.     Multiple Vitamin (MULTIVITAMIN) tablet Take 1 tablet by mouth daily.     TART CHERRY PO Take by mouth.  triamterene-hydrochlorothiazide (DYAZIDE) 37.5-25 MG capsule Take 1 capsule by mouth as needed.     No current facility-administered medications for this visit.    Review of Systems: GENERAL: negative for malaise, night sweats HEENT: No changes in hearing or vision, no nose bleeds or other nasal problems. NECK: Negative for lumps, goiter, pain and significant neck swelling RESPIRATORY: Negative for cough, wheezing CARDIOVASCULAR: Negative for chest pain, leg swelling, palpitations, orthopnea GI: SEE HPI MUSCULOSKELETAL: Negative for joint pain or swelling, back pain, and muscle pain. SKIN: Negative for lesions,  rash HEMATOLOGY Negative for prolonged bleeding, bruising easily, and swollen nodes. ENDOCRINE: Negative for cold or heat intolerance, polyuria, polydipsia and goiter. NEURO: negative for tremor, gait imbalance, syncope and seizures. The remainder of the review of systems is noncontributory.   Physical Exam: BP 138/66   Pulse 77   Temp (!) 97.3 F (36.3 C)   Ht 5' 2 (1.575 m)   Wt 156 lb 6.4 oz (70.9 kg)   BMI 28.61 kg/m  GENERAL: The patient is AO x3, in no acute distress. HEENT: Head is normocephalic and atraumatic. EOMI are intact. Mouth is well hydrated and without lesions. NECK: Supple. No masses LUNGS: Clear to auscultation. No presence of rhonchi/wheezing/rales. Adequate chest expansion HEART: RRR, normal s1 and s2. ABDOMEN: Soft, nontender, no guarding, no peritoneal signs, and nondistended. BS +. No masses.   Imaging/Labs: as above     Latest Ref Rng & Units 02/28/2021    1:53 PM  CBC  WBC 3.8 - 10.8 Thousand/uL 8.3   Hemoglobin 11.7 - 15.5 g/dL 88.1   Hematocrit 64.9 - 45.0 % 35.5   Platelets 140 - 400 Thousand/uL 353    No results found for: IRON, TIBC, FERRITIN  I personally reviewed and interpreted the available labs, imaging and endoscopic files.  Impression and Plan: Betty Wright is a 68 y.o. female with rheumatoid arthritis, fibromyalgia, diabetes on metformin  who presents for evaluation of iron deficiency anemia, vitamin B12 deficiency and multiple hyperplastic gastric polyps  #Hyperplastic gastric polyps #Iron deficiency anemia and Vitamin B12 deficiency   Patient recently had upper endoscopy and colonoscopy by Dr.CATHY.  Colonoscopy was essentially negative and upper endoscopy reportedly had multiple gastric polyps status post hot snare found to be hyperplastic polyp.  Hyperplastic polyp in the stomach are not necessarily considered benign  As per ASGE guideline removal of all hyperplastic polyp greater than 0.5 cm or with irregular  features such as ulceration , bleeding or nodularities recommended  Vitamin B12 deficiency could be due to metformin  use but given underlying autoimmune disease (RA), will evaluate for pernicious anemia by sending anti-intrinsic factor antibody antiparietal antibody, also homocysteine and MMA level  Labs from 08/2023 iron saturation 7% ferritin 19 vitamin B12 312 (low)  folate more than 20 Hemoglobin 10.8 MCV 77  Iron deficiency anemia could be due to large hyperplastic gastric polyp causing intermittent oozing.  After upper endoscopy and removal of subsequent polyp if patient continues to be anemic in next few months we will consider small bowel capsule endoscopy  Will plan for repeat upper endoscopy with removal of all gastric polyp which are large or greater than 0.5 cm  Also given vitamin B12 deficiency and concerns for autoimmune atrophic gastritis we will obtain gastric mapping biopsies  Patient may require subsequent repeat endoscopies depending on the size of polyps removed  If vitamin B12 deficiency iron deficiency does not improve with supplementation may benefit from hematology evaluation in future  All questions were answered.  Betty Wright Prajna Vanderpool, MD Gastroenterology and Hepatology [facility]   This chart has been completed using [facility] Dictation software, and while attempts have been made to ensure accuracy , certain words and phrases may not be transcribed as intended

## 2024-01-09 NOTE — Progress Notes (Signed)
 Shatyra Becka Faizan Samwise Eckardt , M.D. Gastroenterology & Hepatology [facility] [address] [place], [place] [address] Primary Care Physician: Trudy Vaughn FALCON, MD [address] [place] [address]  Chief Complaint:  iron deficiency anemia, vitamin B12 deficiency and multiple hyperplastic gastric polyps  History of Present Illness: Betty Wright is a 68 y.o. female with rheumatoid arthritis, fibromyalgia, diabetes on metformin  who presents for evaluation of iron deficiency anemia, vitamin B12 deficiency and multiple hyperplastic gastric polyps  Patient is accompanied with husband in the clinic.  Patient reports intermittent diarrhea and attributes it to taking metformin  The patient denies having any nausea, vomiting, fever, chills, hematochezia, melena, hematemesis, abdominal distention, abdominal pain,  jaundice, pruritus or weight loss.  Last EGD: 11/15/2023 follow-up EGD performed by Dr. Donny    Path:  Hyperplastic polyps Negative for intestinal metaplasia dysplasia or malignancy  Last Colonoscopy:11/2023    Suggested repeat 10 years  FHx: UNKNOWN as patient was adopted  Social: neg smoking, alcohol  or illicit drug use Surgical: Partial hysterectomy  Negative gastric urease test for H. pylori 11/15/2023  Labs from 08/2023 iron saturation 7% ferritin 19 vitamin B12 312 (low)  folate more than 20 Hemoglobin 10.8 MCV 77 Past Medical History: Past Medical History:  Diagnosis Date   Diabetes (HCC)    Fibromyalgia    Hypertension    Osteopenia    Rheumatoid arthritis (HCC)     Past Surgical History: Past Surgical History:  Procedure Laterality Date   BREAST EXCISIONAL BIOPSY Left    benign   BREAST SURGERY Left    lumpectomy    CESAREAN SECTION     times two   PARTIAL HYSTERECTOMY     TUBAL LIGATION     WISDOM TOOTH EXTRACTION      Family History: Family History  Adopted: Yes  Problem Relation Age of Onset    Healthy Daughter    Healthy Son    Breast cancer Neg Hx     Social History: Social History   Tobacco Use  Smoking Status Never   Passive exposure: Past  Smokeless Tobacco Never   Social History   Substance and Sexual Activity  Alcohol  Use Never   Social History   Substance and Sexual Activity  Drug Use Never    Allergies: Allergies  Allergen Reactions   Erythromycin    Flonase [Fluticasone]    Influenza A (H1n1) Monovalent Vaccine     She wasn't sure which one but cant take the flu shot - arm became swollen.   Olmesartan     Muscle spasms   Valsartan     Muscle spasms   Penicillins Rash    Medications: Current Outpatient Medications  Medication Sig Dispense Refill   amLODipine (NORVASC) 5 MG tablet Take 5 mg by mouth daily.     glipiZIDE  (GLUCOTROL  XL) 10 MG 24 hr tablet Take 10 mg by mouth daily.     lansoprazole (PREVACID) 30 MG capsule Take 30 mg by mouth daily at 12 noon. Pt unsure of dose     levocetirizine (XYZAL) 5 MG tablet Take 5 mg by mouth every evening.     metFORMIN  (GLUCOPHAGE -XR) 500 MG 24 hr tablet Take 500 mg by mouth 2 (two) times daily.     metoprolol succinate (TOPROL-XL) 25 MG 24 hr tablet Take 25 mg by mouth daily.     Multiple Vitamin (MULTIVITAMIN) tablet Take 1 tablet by mouth daily.     TART CHERRY PO Take by mouth.  triamterene-hydrochlorothiazide (DYAZIDE) 37.5-25 MG capsule Take 1 capsule by mouth as needed.     No current facility-administered medications for this visit.    Review of Systems: GENERAL: negative for malaise, night sweats HEENT: No changes in hearing or vision, no nose bleeds or other nasal problems. NECK: Negative for lumps, goiter, pain and significant neck swelling RESPIRATORY: Negative for cough, wheezing CARDIOVASCULAR: Negative for chest pain, leg swelling, palpitations, orthopnea GI: SEE HPI MUSCULOSKELETAL: Negative for joint pain or swelling, back pain, and muscle pain. SKIN: Negative for lesions,  rash HEMATOLOGY Negative for prolonged bleeding, bruising easily, and swollen nodes. ENDOCRINE: Negative for cold or heat intolerance, polyuria, polydipsia and goiter. NEURO: negative for tremor, gait imbalance, syncope and seizures. The remainder of the review of systems is noncontributory.   Physical Exam: BP 138/66   Pulse 77   Temp (!) 97.3 F (36.3 C)   Ht 5\' 2"  (1.575 m)   Wt 156 lb 6.4 oz (70.9 kg)   BMI 28.61 kg/m  GENERAL: The patient is AO x3, in no acute distress. HEENT: Head is normocephalic and atraumatic. EOMI are intact. Mouth is well hydrated and without lesions. NECK: Supple. No masses LUNGS: Clear to auscultation. No presence of rhonchi/wheezing/rales. Adequate chest expansion HEART: RRR, normal s1 and s2. ABDOMEN: Soft, nontender, no guarding, no peritoneal signs, and nondistended. BS +. No masses.   Imaging/Labs: as above     Latest Ref Rng & Units 02/28/2021    1:53 PM  CBC  WBC 3.8 - 10.8 Thousand/uL 8.3   Hemoglobin 11.7 - 15.5 g/dL 24.4   Hematocrit 01.0 - 45.0 % 35.5   Platelets 140 - 400 Thousand/uL 353    No results found for: "IRON", "TIBC", "FERRITIN"  I personally reviewed and interpreted the available labs, imaging and endoscopic files.  Impression and Plan: Betty Wright is a 69 y.o. female with rheumatoid arthritis, fibromyalgia, diabetes on metformin who presents for evaluation of iron deficiency anemia, vitamin B12 deficiency and multiple hyperplastic gastric polyps  #Hyperplastic gastric polyps #Iron deficiency anemia and Vitamin B12 deficiency   Patient recently had upper endoscopy and colonoscopy by Dr.CATHY.  Colonoscopy was essentially negative and upper endoscopy reportedly had multiple gastric polyps status post hot snare found to be hyperplastic polyp.  Hyperplastic polyp in the stomach are not necessarily considered benign  As per ASGE guideline removal of all hyperplastic polyp greater than 0.5 cm or with irregular  features such as ulceration , bleeding or nodularities recommended  Vitamin B12 deficiency could be due to metformin use but given underlying autoimmune disease (RA), will evaluate for pernicious anemia by sending anti-intrinsic factor antibody antiparietal antibody, also homocysteine and MMA level  Labs from 08/2023 iron saturation 7% ferritin 19 vitamin B12 312 (low)  folate more than 20 Hemoglobin 10.8 MCV 77  Iron deficiency anemia could be due to large hyperplastic gastric polyp causing intermittent oozing.  After upper endoscopy and removal of subsequent polyp if patient continues to be anemic in next few months we will consider small bowel capsule endoscopy  Will plan for repeat upper endoscopy with removal of all gastric polyp which are large or greater than 0.5 cm  Also given vitamin B12 deficiency and concerns for autoimmune atrophic gastritis we will obtain gastric mapping biopsies  Patient may require subsequent repeat endoscopies depending on the size of polyps removed  If vitamin B12 deficiency iron deficiency does not improve with supplementation may benefit from hematology evaluation in future  All questions were answered.  Betty Wright Faizan Prajna Vanderpool, MD Gastroenterology and Hepatology [facility]   This chart has been completed using [facility] Dictation software, and while attempts have been made to ensure accuracy , certain words and phrases may not be transcribed as intended

## 2024-01-09 NOTE — Patient Instructions (Signed)
 It was very nice to meet you today, as dicussed with will plan for the following :  1) Labwork  2) Vitamin B12 and iron supplementation   3) upper endoscopy

## 2024-01-10 NOTE — Addendum Note (Signed)
 Addended by: Elick Aguilera on: 01/10/2024 09:43 AM   Modules accepted: Orders

## 2024-01-13 LAB — INTRINSIC FACTOR ANTIBODIES: Intrinsic Factor Abs, Serum: 1.1 [AU]/ml (ref 0.0–1.1)

## 2024-01-13 LAB — IRON,TIBC AND FERRITIN PANEL
Ferritin: 14 ng/mL — ABNORMAL LOW (ref 15–150)
Iron Saturation: 7 % — CL (ref 15–55)
Iron: 27 ug/dL (ref 27–139)
Total Iron Binding Capacity: 387 ug/dL (ref 250–450)
UIBC: 360 ug/dL (ref 118–369)

## 2024-01-13 LAB — VITAMIN B12: Vitamin B-12: 675 pg/mL (ref 232–1245)

## 2024-01-13 LAB — CBC
Hematocrit: 34.6 % (ref 34.0–46.6)
Hemoglobin: 11.3 g/dL (ref 11.1–15.9)
MCH: 24.4 pg — ABNORMAL LOW (ref 26.6–33.0)
MCHC: 32.7 g/dL (ref 31.5–35.7)
MCV: 75 fL — ABNORMAL LOW (ref 79–97)
Platelets: 433 10*3/uL (ref 150–450)
RBC: 4.63 x10E6/uL (ref 3.77–5.28)
RDW: 17 % — ABNORMAL HIGH (ref 11.7–15.4)
WBC: 8.8 10*3/uL (ref 3.4–10.8)

## 2024-01-13 LAB — CELIAC AB TTG DGP TIGA
Antigliadin Abs, IgA: 5 U (ref 0–19)
Gliadin IgG: 2 U (ref 0–19)
IgA/Immunoglobulin A, Serum: 251 mg/dL (ref 87–352)
Tissue Transglut Ab: 3 U/mL (ref 0–5)

## 2024-01-13 LAB — HOMOCYSTEINE: Homocysteine: 15.1 umol/L (ref 0.0–17.2)

## 2024-01-13 LAB — ANTI-PARIETAL ANTIBODY: Parietal Cell Ab: 7.4 U (ref 0.0–20.0)

## 2024-01-14 ENCOUNTER — Other Ambulatory Visit (HOSPITAL_COMMUNITY)
Admission: RE | Admit: 2024-01-14 | Discharge: 2024-01-14 | Disposition: A | Discharge status: Home / Self Care | Payer: Medicare Other | Source: Ambulatory Visit | Attending: Gastroenterology | Admitting: Gastroenterology

## 2024-01-14 DIAGNOSIS — K317 Polyp of stomach and duodenum: Secondary | ICD-10-CM | POA: Diagnosis not present

## 2024-01-14 DIAGNOSIS — D500 Iron deficiency anemia secondary to blood loss (chronic): Secondary | ICD-10-CM | POA: Insufficient documentation

## 2024-01-14 LAB — BASIC METABOLIC PANEL
Anion gap: 11 (ref 5–15)
BUN: 22 mg/dL (ref 8–23)
CO2: 26 mmol/L (ref 22–32)
Calcium: 9.6 mg/dL (ref 8.9–10.3)
Chloride: 99 mmol/L (ref 98–111)
Creatinine, Ser: 1.2 mg/dL — ABNORMAL HIGH (ref 0.44–1.00)
GFR, Estimated: 49 mL/min — ABNORMAL LOW (ref >60)
Glucose, Bld: 146 mg/dL — ABNORMAL HIGH (ref 70–99)
Potassium: 4.1 mmol/L (ref 3.5–5.1)
Sodium: 136 mmol/L (ref 135–145)

## 2024-01-15 NOTE — Progress Notes (Signed)
 Hi Betty Wright ,  Can you please call the patient and tell the patient the lab work shows severe iron deficiency, which is known from previous lab work but without anemia.  Did not show any evidence of pernicious anemia or celiac disease  Also shows that you are not vitamin B12 deficient presently which is a good news  I recommend upper endoscopy as previously scheduled and we will discuss management of iron deficiency after that  Thanks,  Jorene Kaylor Faizan Evangelia Whitaker, MD Gastroenterology and Hepatology [facility] Gastroenterology ==============  Vitamin B12 have normalized and negative blood work for pernicious anemia with negative intrinsic factor antibody antiparietal antibody Negative celiac serologies

## 2024-01-17 DIAGNOSIS — K080 Exfoliation of teeth due to systemic causes: Secondary | ICD-10-CM | POA: Diagnosis not present

## 2024-01-21 DIAGNOSIS — M069 Rheumatoid arthritis, unspecified: Secondary | ICD-10-CM | POA: Diagnosis not present

## 2024-01-21 DIAGNOSIS — I10 Essential (primary) hypertension: Secondary | ICD-10-CM | POA: Diagnosis not present

## 2024-01-21 DIAGNOSIS — Z6828 Body mass index (BMI) 28.0-28.9, adult: Secondary | ICD-10-CM | POA: Diagnosis not present

## 2024-01-21 DIAGNOSIS — K219 Gastro-esophageal reflux disease without esophagitis: Secondary | ICD-10-CM | POA: Diagnosis not present

## 2024-01-21 DIAGNOSIS — E1165 Type 2 diabetes mellitus with hyperglycemia: Secondary | ICD-10-CM | POA: Diagnosis not present

## 2024-01-21 LAB — METHYLMALONIC ACID(MMA), RND URINE
Creatinine(Crt), U: 1.79 g/L (ref 0.30–3.00)
MMA - Normalized: 0.9 umol/mmol{creat} (ref 0.5–3.4)
Methylmalonic Acid, Ur: 14.9 umol/L (ref 1.6–29.7)

## 2024-01-23 ENCOUNTER — Encounter (HOSPITAL_COMMUNITY)
Admission: RE | Disposition: A | Discharge status: Home / Self Care | Payer: Self-pay | Source: Home / Self Care | Attending: Gastroenterology

## 2024-01-23 ENCOUNTER — Encounter (HOSPITAL_COMMUNITY): Payer: Self-pay | Admitting: Gastroenterology

## 2024-01-23 ENCOUNTER — Other Ambulatory Visit: Payer: Self-pay

## 2024-01-23 ENCOUNTER — Ambulatory Visit (HOSPITAL_COMMUNITY)
Admission: RE | Admit: 2024-01-23 | Discharge: 2024-01-23 | Disposition: A | Discharge status: Home / Self Care | Payer: Medicare Other | Attending: Gastroenterology | Admitting: Gastroenterology

## 2024-01-23 ENCOUNTER — Telehealth (INDEPENDENT_AMBULATORY_CARE_PROVIDER_SITE_OTHER): Payer: Self-pay | Admitting: *Deleted

## 2024-01-23 ENCOUNTER — Ambulatory Visit (HOSPITAL_COMMUNITY): Payer: Self-pay | Admitting: Anesthesiology

## 2024-01-23 ENCOUNTER — Ambulatory Visit (HOSPITAL_BASED_OUTPATIENT_CLINIC_OR_DEPARTMENT_OTHER): Payer: Medicare Other | Admitting: Anesthesiology

## 2024-01-23 DIAGNOSIS — D649 Anemia, unspecified: Secondary | ICD-10-CM | POA: Diagnosis not present

## 2024-01-23 DIAGNOSIS — Z7722 Contact with and (suspected) exposure to environmental tobacco smoke (acute) (chronic): Secondary | ICD-10-CM | POA: Insufficient documentation

## 2024-01-23 DIAGNOSIS — Z7984 Long term (current) use of oral hypoglycemic drugs: Secondary | ICD-10-CM | POA: Insufficient documentation

## 2024-01-23 DIAGNOSIS — M069 Rheumatoid arthritis, unspecified: Secondary | ICD-10-CM | POA: Insufficient documentation

## 2024-01-23 DIAGNOSIS — E538 Deficiency of other specified B group vitamins: Secondary | ICD-10-CM | POA: Insufficient documentation

## 2024-01-23 DIAGNOSIS — M797 Fibromyalgia: Secondary | ICD-10-CM | POA: Insufficient documentation

## 2024-01-23 DIAGNOSIS — E119 Type 2 diabetes mellitus without complications: Secondary | ICD-10-CM | POA: Insufficient documentation

## 2024-01-23 DIAGNOSIS — D509 Iron deficiency anemia, unspecified: Secondary | ICD-10-CM | POA: Diagnosis not present

## 2024-01-23 DIAGNOSIS — K317 Polyp of stomach and duodenum: Secondary | ICD-10-CM

## 2024-01-23 DIAGNOSIS — K2970 Gastritis, unspecified, without bleeding: Secondary | ICD-10-CM

## 2024-01-23 DIAGNOSIS — K3189 Other diseases of stomach and duodenum: Secondary | ICD-10-CM | POA: Insufficient documentation

## 2024-01-23 HISTORY — PX: BIOPSY: SHX5522

## 2024-01-23 HISTORY — PX: POLYPECTOMY: SHX149

## 2024-01-23 HISTORY — PX: ESOPHAGOGASTRODUODENOSCOPY (EGD) WITH PROPOFOL: SHX5813

## 2024-01-23 LAB — GLUCOSE, CAPILLARY: Glucose-Capillary: 150 mg/dL — ABNORMAL HIGH (ref 70–99)

## 2024-01-23 SURGERY — ESOPHAGOGASTRODUODENOSCOPY (EGD) WITH PROPOFOL
Anesthesia: General

## 2024-01-23 MED ORDER — PROPOFOL 10 MG/ML IV BOLUS
INTRAVENOUS | Status: DC | PRN
  Administered 2024-01-23 (×2): 50 mg via INTRAVENOUS
  Administered 2024-01-23: 100 mg via INTRAVENOUS
  Administered 2024-01-23: 50 mg via INTRAVENOUS

## 2024-01-23 MED ORDER — PROPOFOL 500 MG/50ML IV EMUL
INTRAVENOUS | Status: DC | PRN
  Administered 2024-01-23: 150 ug/kg/min via INTRAVENOUS

## 2024-01-23 MED ORDER — LIDOCAINE HCL (PF) 2 % IJ SOLN
INTRAMUSCULAR | Status: DC | PRN
  Administered 2024-01-23: 100 mg via INTRADERMAL

## 2024-01-23 MED ORDER — LIDOCAINE HCL (PF) 2 % IJ SOLN
INTRAMUSCULAR | Status: AC
  Filled 2024-01-23: qty 5

## 2024-01-23 MED ORDER — LACTATED RINGERS IV SOLN: INTRAVENOUS | Status: DC

## 2024-01-23 MED ORDER — PHENYLEPHRINE 80 MCG/ML (10ML) SYRINGE FOR IV PUSH (FOR BLOOD PRESSURE SUPPORT)
PREFILLED_SYRINGE | INTRAVENOUS | Status: DC | PRN
  Administered 2024-01-23 (×2): 80 ug via INTRAVENOUS

## 2024-01-23 MED ORDER — PROPOFOL 500 MG/50ML IV EMUL
INTRAVENOUS | Status: AC
  Filled 2024-01-23: qty 50

## 2024-01-23 MED ORDER — DEXMEDETOMIDINE HCL IN NACL 80 MCG/20ML IV SOLN
INTRAVENOUS | Status: DC | PRN
  Administered 2024-01-23: 8 ug via INTRAVENOUS
  Administered 2024-01-23: 12 ug via INTRAVENOUS

## 2024-01-23 MED ORDER — STERILE WATER FOR IRRIGATION IR SOLN
Status: DC | PRN
  Administered 2024-01-23: 60 mL

## 2024-01-23 NOTE — Anesthesia Preprocedure Evaluation (Signed)
Anesthesia Evaluation  Patient identified by MRN, date of birth, ID band Patient awake    Reviewed: Allergy & Precautions, H&P , NPO status , Patient's Chart, lab work & pertinent test results, reviewed documented beta blocker date and time   Airway Mallampati: II  TM Distance: >3 FB Neck ROM: full    Dental no notable dental hx.    Pulmonary neg pulmonary ROS   Pulmonary exam normal breath sounds clear to auscultation       Cardiovascular Exercise Tolerance: Good hypertension,  Rhythm:regular Rate:Normal     Neuro/Psych  Neuromuscular disease  negative psych ROS   GI/Hepatic negative GI ROS, Neg liver ROS,,,  Endo/Other  diabetes    Renal/GU negative Renal ROS  negative genitourinary   Musculoskeletal   Abdominal   Peds  Hematology  (+) Blood dyscrasia, anemia   Anesthesia Other Findings   Reproductive/Obstetrics negative OB ROS                             Anesthesia Physical Anesthesia Plan  ASA: 2  Anesthesia Plan: General   Post-op Pain Management:    Induction:   PONV Risk Score and Plan: Propofol infusion  Airway Management Planned:   Additional Equipment:   Intra-op Plan:   Post-operative Plan:   Informed Consent: I have reviewed the patients History and Physical, chart, labs and discussed the procedure including the risks, benefits and alternatives for the proposed anesthesia with the patient or authorized representative who has indicated his/her understanding and acceptance.     Dental Advisory Given  Plan Discussed with: CRNA  Anesthesia Plan Comments:        Anesthesia Quick Evaluation

## 2024-01-23 NOTE — Transfer of Care (Signed)
**Note De-Identified Betty Obfuscation** Immediate Anesthesia Transfer of Care Note  Patient: Betty Wright  Procedure(s) Performed: ESOPHAGOGASTRODUODENOSCOPY (EGD) WITH PROPOFOL POLYPECTOMY INTESTINAL BIOPSY  Patient Location: Endoscopy Unit  Anesthesia Type:General  Level of Consciousness: drowsy  Airway & Oxygen Therapy: Patient Spontanous Breathing  Post-op Assessment: Report given to RN and Post -op Vital signs reviewed and stable  Post vital signs: Reviewed and stable  Last Vitals:  Vitals Value Taken Time  BP    Temp    Pulse    Resp    SpO2      Last Pain:  Vitals:   01/23/24 0906  TempSrc:   PainSc: 0-No pain      Patients Stated Pain Goal: 3 (01/23/24 0636)  Complications: No notable events documented.

## 2024-01-23 NOTE — Interval H&P Note (Signed)
History and Physical Interval Note:  01/23/2024 8:26 AM  Betty Wright  has presented today for surgery, with the diagnosis of IRON DEFICIENCY ANEMIA, GASTRIC POLYP.  The various methods of treatment have been discussed with the patient and family. After consideration of risks, benefits and other options for treatment, the patient has consented to  Procedure(s) with comments: ESOPHAGOGASTRODUODENOSCOPY (EGD) WITH PROPOFOL (N/A) - 7:30AM;ASA 1-2 as a surgical intervention.  The patient's history has been reviewed, patient examined, no change in status, stable for surgery.  I have reviewed the patient's chart and labs.  Questions were answered to the patient's satisfaction.     [name]

## 2024-01-23 NOTE — Telephone Encounter (Signed)
-----   Message from Franky Macho sent at 01/23/2024 10:06 AM EST ----- Regarding: ref Hi  Betty Wright  Can you please refer to hematology for iron deficiency and Vitamin b12 anemia

## 2024-01-23 NOTE — Anesthesia Procedure Notes (Signed)
Date/Time: 01/23/2024 9:05 AM  Performed by: Julian Reil, CRNAPre-anesthesia Checklist: Patient identified, Emergency Drugs available, Patient being monitored and Suction available Patient Re-evaluated:Patient Re-evaluated prior to induction Oxygen Delivery Method: Nasal cannula Induction Type: IV induction Placement Confirmation: positive ETCO2

## 2024-01-23 NOTE — Discharge Instructions (Signed)

## 2024-01-23 NOTE — Op Note (Signed)
[facility] Patient Name: Betty Wright Procedure Date: 01/23/2024 7:12 AM MRN: [id number] Date of Birth: [birth year] Attending MD: Sanjuan Dame , MD, [id number] CSN: [id number] Age: 68 Admit Type: Outpatient Procedure:                Upper GI endoscopy Indications:              For therapy of gastric polyps Providers:                Sanjuan Dame, MD, Crystal Page, Kristine L. Jessee Avers, Technician Referring MD:              Medicines:                Monitored Anesthesia Care Complications:            No immediate complications. Estimated Blood Loss:     Estimated blood loss was minimal. Procedure:                Pre-Anesthesia Assessment:                           - Prior to the procedure, a History and Physical                            was performed, and patient medications and                            allergies were reviewed. The patient's tolerance of                            previous anesthesia was also reviewed. The risks                            and benefits of the procedure and the sedation                            options and risks were discussed with the patient.                            All questions were answered, and informed consent                            was obtained. Prior Anticoagulants: The patient has                            taken no anticoagulant or antiplatelet agents. ASA                            Grade Assessment: II - A patient with mild systemic                            disease. After reviewing the risks and benefits,  the patient was deemed in satisfactory condition to                            undergo the procedure.                           After obtaining informed consent, the endoscope was                            passed under direct vision. Throughout the                            procedure, the patient's blood pressure, pulse, and                             oxygen saturations were monitored continuously. The                            [id number]-[id number] ([id number]) scope was introduced through the                            mouth, and advanced to the second part of duodenum.                            The upper GI endoscopy was accomplished without                            difficulty. The patient tolerated the procedure                            well. Scope In: 9:10:16 AM Scope Out: 9:56:02 AM Total Procedure Duration: 0 hours 45 minutes 46 seconds  Findings:      The examined esophagus was normal.      28 4 to 15 mm sessile polyps with no bleeding and no stigmata of recent       bleeding were found in the entire examined stomach. The polyp was       removed with a cold snare. Resection and retrieval were complete.      Four 25 to 30 mm pedunculated polyps with no bleeding and no stigmata of       recent bleeding were found in the stomach. The polyp was removed with a       hot snare. Resection and retrieval were complete. Estimated blood loss:       none.      Mildly erythematous mucosa without bleeding was found in the gastric       antrum. Biopsies were taken with a cold forceps for histology.      The duodenal bulb and second portion of the duodenum were normal.       Biopsies for histology were taken with a cold forceps for evaluation of       celiac disease. Impression:               - Normal esophagus.                           - 28 gastric  polyps. Resected and retrieved. via                            [employer]                           - Four gastric polyps. Resected and retrieved.                           - Erythematous mucosa in the antrum. Biopsied.                           - Normal duodenal bulb and second portion of the                            duodenum. Biopsied. Moderate Sedation:      Per Anesthesia Care Recommendation:           - Patient has a contact number available for                            emergencies. The signs and  symptoms of potential                            delayed complications were discussed with the                            patient. Return to normal activities tomorrow.                            Written discharge instructions were provided to the                            patient.                           - Resume previous diet.                           - Continue present medications.                           - Await pathology results.                           - Repeat upper endoscopy in 6 months for                            surveillance given history of hyperplastic gastric                            polyps and unknown family history , some polyps may                            have left behind                           -  Return to GI clinic as previously scheduled.                           -referral to Hematrology for IDA Procedure Code(s):        --- Professional ---                           [phone], Esophagogastroduodenoscopy, flexible,                            transoral; with removal of tumor(s), polyp(s), or                            other lesion(s) by snare technique                           43239, 59, Esophagogastroduodenoscopy, flexible,                            transoral; with biopsy, single or multiple Diagnosis Code(s):        --- Professional ---                           K31.7, Polyp of stomach and duodenum                           K31.89, Other diseases of stomach and duodenum CPT copyright 2022 American Medical Association. All rights reserved. The codes documented in this report are preliminary and upon coder review may  be revised to meet current compliance requirements. Sanjuan Dame, MD Sanjuan Dame, MD 01/23/2024 10:06:03 AM This report has been signed electronically. Number of Addenda: 0

## 2024-01-23 NOTE — Telephone Encounter (Signed)
 Referral sent, they will contact patient with apt

## 2024-01-24 ENCOUNTER — Encounter (HOSPITAL_COMMUNITY): Payer: Self-pay | Admitting: Gastroenterology

## 2024-01-24 LAB — SURGICAL PATHOLOGY

## 2024-01-25 NOTE — Anesthesia Postprocedure Evaluation (Signed)
Anesthesia Post Note  Patient: Betty Wright  Procedure(s) Performed: ESOPHAGOGASTRODUODENOSCOPY (EGD) WITH PROPOFOL POLYPECTOMY INTESTINAL BIOPSY  Patient location during evaluation: Phase II Anesthesia Type: General Level of consciousness: awake Pain management: pain level controlled Vital Signs Assessment: post-procedure vital signs reviewed and stable Respiratory status: spontaneous breathing and respiratory function stable Cardiovascular status: blood pressure returned to baseline and stable Postop Assessment: no headache and no apparent nausea or vomiting Anesthetic complications: no Comments: Late entry   No notable events documented.   Last Vitals:  Vitals:   01/23/24 0648 01/23/24 1000  BP: (!) 138/56 (!) 107/48  Pulse: 70 65  Resp: (!) 9 20  Temp: 36.9 C 36.9 C  SpO2: 100% 96%    Last Pain:  Vitals:   01/23/24 1000  TempSrc: Oral  PainSc: 0-No pain                 Windell Norfolk

## 2024-01-28 ENCOUNTER — Inpatient Hospital Stay: Payer: Medicare Other | Attending: Oncology | Admitting: Oncology

## 2024-01-28 ENCOUNTER — Inpatient Hospital Stay: Payer: Medicare Other

## 2024-01-28 VITALS — BP 139/86 | HR 69 | Temp 98.1°F | Resp 18 | Ht 62.0 in | Wt 157.2 lb

## 2024-01-28 DIAGNOSIS — D500 Iron deficiency anemia secondary to blood loss (chronic): Secondary | ICD-10-CM | POA: Insufficient documentation

## 2024-01-28 DIAGNOSIS — K317 Polyp of stomach and duodenum: Secondary | ICD-10-CM | POA: Insufficient documentation

## 2024-01-28 DIAGNOSIS — Z79899 Other long term (current) drug therapy: Secondary | ICD-10-CM | POA: Diagnosis not present

## 2024-01-28 DIAGNOSIS — R5382 Chronic fatigue, unspecified: Secondary | ICD-10-CM | POA: Diagnosis not present

## 2024-01-28 DIAGNOSIS — E611 Iron deficiency: Secondary | ICD-10-CM | POA: Insufficient documentation

## 2024-01-28 NOTE — Assessment & Plan Note (Signed)
 Likely secondary to previous gastric polyp bleeding. Patient reports chronic fatigue. Currently taking oral iron supplementation (Slow FE) every other day. Not currently anemic, but iron levels are low. -The most likely cause of her anemia is due to chronic blood loss. We discussed some of the risks, benefits, and alternatives of intravenous iron infusions. The patient is symptomatic from anemia and the iron level is critically low. She tolerated oral iron supplement poorly and desires to achieved higher levels of iron faster for adequate hematopoesis. Some of the side-effects to be expected including risks of infusion reactions, phlebitis, headaches, nausea and fatigue.  The patient is willing to proceed. Patient education material was dispensed. Goal is to keep ferritin level greater than 50 and resolution of anemia  -Continue Slow FE every other day.  -Repeat labs and follow-up in six weeks post-infusion.

## 2024-01-28 NOTE — Patient Instructions (Signed)
 VISIT SUMMARY:  During today's visit, we discussed your ongoing issues with chronic fatigue and low energy levels, which you have been experiencing for years. We reviewed your history of a gastric polyp and rheumatoid arthritis, and we talked about your current treatments and management strategies.  YOUR PLAN:  -IRON DEFICIENCY: Iron deficiency means your body has low levels of iron, which can cause fatigue and low energy. This is likely due to previous bleeding from your gastric polyp. You should continue taking your Slow FE iron supplement every other day. Additionally, you will receive two doses of IV iron at the infusion center next week. We will repeat your labs and follow up in six weeks after the infusion.  INSTRUCTIONS:  Please follow up in six weeks after your IV iron infusions for repeat labs and further evaluation.

## 2024-01-28 NOTE — Progress Notes (Signed)
 [facility] at [facility] HEMATOLOGY NEW VISIT  Betty Potts, MD  REASON FOR REFERRAL: Iron deficiency anemia   HISTORY OF PRESENT ILLNESS: Betty Wright 68 y.o. female referred for iron deficiency anemia.  She reports feeling tired 'all the time' and 'never feeling energetic,' a condition that has persisted for 'years.' She denies dizziness and significant shortness of breath. She has been taking slow FE iron supplements every other day, but admits to occasionally forgetting doses. She has no other complaints and denies abdominal pain. She has a history of rheumatoid arthritis and was previously on methotrexate, which she stopped taking approximately two to three years ago due to weakness and difficulty with mobility. She has been managing her symptoms with tart cherry and turmeric for inflammation.She has no other complaints today.  We discussed that her iron deficiency is likely secondary to the gastric polyp that was probably occasionally bleeding.    Patient is a non-smoker, nonalcoholic.  Lives in [place] with her husband and is retired currently.  I have reviewed the past medical history, past surgical history, social history and family history with the patient   ALLERGIES:  is allergic to erythromycin, flonase [fluticasone], influenza a (h1n1) monovalent vaccine, olmesartan, valsartan, and penicillins.  MEDICATIONS:  Current Outpatient Medications  Medication Sig Dispense Refill   amLODipine (NORVASC) 5 MG tablet Take 5 mg by mouth daily.     ferrous gluconate (FERGON) 324 MG tablet Take by mouth.     glipiZIDE (GLUCOTROL XL) 10 MG 24 hr tablet Take 10 mg by mouth daily.     glipiZIDE (GLUCOTROL) 5 MG tablet Take by mouth.     lansoprazole (PREVACID) 30 MG capsule Take 30 mg by mouth daily at 12 noon. Pt unsure of dose     levocetirizine (XYZAL) 5 MG tablet Take 5 mg by mouth every evening.     metFORMIN (GLUCOPHAGE-XR) 500 MG 24 hr tablet Take by  mouth.     metoprolol succinate (TOPROL-XL) 25 MG 24 hr tablet Take 25 mg by mouth daily.     Multiple Vitamin (MULTIVITAMIN) tablet Take 1 tablet by mouth daily.     TART CHERRY PO Take by mouth.     triamterene-hydrochlorothiazide (DYAZIDE) 37.5-25 MG capsule Take 1 capsule by mouth as needed.     No current facility-administered medications for this visit.     REVIEW OF SYSTEMS:   Constitutional: Denies fevers, chills or night sweats Eyes: Denies blurriness of vision Ears, nose, mouth, throat, and face: Denies mucositis or sore throat Respiratory: Denies cough, dyspnea or wheezes Cardiovascular: Denies palpitation, chest discomfort or lower extremity swelling Gastrointestinal:  Denies nausea, heartburn or change in bowel habits Skin: Denies abnormal skin rashes Lymphatics: Denies new lymphadenopathy or easy bruising Neurological:Denies numbness, tingling or new weaknesses Behavioral/Psych: Mood is stable, no new changes  All other systems were reviewed with the patient and are negative.  PHYSICAL EXAMINATION:   Vitals:   01/28/24 1318  BP: 139/86  Pulse: 69  Resp: 18  Temp: 98.1 F (36.7 C)  SpO2: 100%    GENERAL:alert, no distress and comfortable SKIN: skin color, texture, turgor are normal, no rashes or significant lesions LUNGS: clear to auscultation and percussion with normal breathing effort HEART: regular rate & rhythm and no murmurs and no lower extremity edema ABDOMEN:abdomen soft, non-tender and normal bowel sounds Musculoskeletal:no cyanosis of digits and no clubbing  NEURO: alert & oriented x 3 with fluent speech  LABORATORY DATA:  I have  reviewed the data as listed  Lab Results  Component Value Date   WBC 8.8 01/09/2024   NEUTROABS 5,993 02/28/2021   HGB 11.3 01/09/2024   HCT 34.6 01/09/2024   MCV 75 (L) 01/09/2024   PLT 433 01/09/2024      Component Value Date/Time   NA 136 01/14/2024 1324   K 4.1 01/14/2024 1324   CL 99 01/14/2024 1324    CO2 26 01/14/2024 1324   GLUCOSE 146 (H) 01/14/2024 1324   BUN 22 01/14/2024 1324   CREATININE 1.20 (H) 01/14/2024 1324   CREATININE 1.04 (H) 02/25/2021 0000   CALCIUM 9.6 01/14/2024 1324   PROT 7.1 02/28/2021 1353   AST 18 02/25/2021 0000   ALT 9 02/25/2021 0000   BILITOT 0.4 02/25/2021 0000   GFRNONAA 49 (L) 01/14/2024 1324   GFRNONAA 56 (L) 02/25/2021 0000   GFRAA 65 02/25/2021 0000    Latest Reference Range & Units 01/09/24 15:51  Iron 27 - 139 ug/dL 27  UIBC 409 - 811 ug/dL 914  TIBC 782 - 956 ug/dL 213  Ferritin 15 - 086 ng/mL 14 (L)  Iron Saturation 15 - 55 % 7 (LL)  (LL): Data is critically low (L): Data is abnormally low   Latest Reference Range & Units 08/21/19 16:10 01/09/24 15:51  Vitamin B12 232 - 1,245 pg/mL 307 675   Endoscopy: 01/23/24: Impression: - Normal esophagus. - 28 gastric polyps. Resected and retrieved. via [employer] - Four gastric polyps. Resected and retrieved. - Erythematous mucosa in the antrum. Biopsied. - Normal duodenal bulb and second portion of the duodenum. Biopsied.  Pathology: FINAL MICROSCOPIC DIAGNOSIS:   A. SMALL BOWEL, BIOPSY:  - Small intestinal mucosa with no specific histopathologic changes  - Negative for increased intraepithelial lymphocytes or villous  architectural changes   B. STOMACH, POLYPECTOMY:  - Fundic gland polyp(s)  - Negative for dysplasia   C. STOMACH, BIOPSY:  - Gastric antral and oxyntic mucosa with mild nonspecific reactive  gastropathy  - Helicobacter pylori-like organisms are not identified on routine HE  stain   ASSESSMENT & PLAN:  Patient is a 68 year old female referred for iron deficiency   Iron deficiency Likely secondary to previous gastric polyp bleeding. Patient reports chronic fatigue. Currently taking oral iron supplementation (Slow FE) every other day. Not currently anemic, but iron levels are low. -The most likely cause of her anemia is due to chronic blood loss. We discussed some of the  risks, benefits, and alternatives of intravenous iron infusions. The patient is symptomatic from anemia and the iron level is critically low. She tolerated oral iron supplement poorly and desires to achieved higher levels of iron faster for adequate hematopoesis. Some of the side-effects to be expected including risks of infusion reactions, phlebitis, headaches, nausea and fatigue.  The patient is willing to proceed. Patient education material was dispensed. Goal is to keep ferritin level greater than 50 and resolution of anemia  -Continue Slow FE every other day.  -Repeat labs and follow-up in six weeks post-infusion.   Orders Placed This Encounter  Procedures   Ferritin    Standing Status:   Future    Expected Date:   03/10/2024    Expiration Date:   01/27/2025   Folate    Standing Status:   Future    Expected Date:   03/10/2024    Expiration Date:   01/27/2025   Vitamin B12    Standing Status:   Future    Expected Date:   03/10/2024  Expiration Date:   01/27/2025   CBC with Differential/Platelet    Standing Status:   Future    Expected Date:   03/10/2024    Expiration Date:   01/27/2025   Comprehensive metabolic panel    Standing Status:   Future    Expected Date:   03/10/2024    Expiration Date:   01/27/2025   Iron and TIBC    Standing Status:   Future    Expected Date:   03/10/2024    Expiration Date:   01/27/2025    The total time spent in the appointment was 40 minutes encounter with patients including review of chart and various tests results, discussions about plan of care and coordination of care plan   All questions were answered. The patient knows to call the clinic with any problems, questions or concerns. No barriers to learning was detected.   Cindie Crumbly, MD 2/24/20251:51 PM

## 2024-01-29 NOTE — Progress Notes (Signed)
 I reviewed the pathology results. Betty Wright, can you send her a letter with the findings as described below please?  Thanks,  Vista Lawman, MD Gastroenterology and Hepatology [facility] Gastroenterology  ---------------------------------------------------------------------------------------------  [facility]enter[facility] [address], [address], [place], [place] [address] Phone:  [phone]   01/29/24 [place], [place]   Dear Bobette Mo,  I am writing to inform you that the biopsies taken during your recent endoscopic examination showed:  FINAL MICROSCOPIC DIAGNOSIS:   A. SMALL BOWEL, BIOPSY:  - Small intestinal mucosa with no specific histopathologic changes  - Negative for increased intraepithelial lymphocytes or villous  architectural changes   B. STOMACH, POLYPECTOMY:  - Fundic gland polyp(s)  - Negative for dysplasia   C. STOMACH, BIOPSY:  - Gastric antral and oxyntic mucosa with mild nonspecific reactive  gastropathy  - Helicobacter pylori-like organisms are not identified on routine HE  stain   What does this mean?  The biopsies showed no inflammation in the small bowel and the stomach . No precancerous changes were noted. No H pylori bacteria was noted . The polyps removed were benign . This is all good news . We will see you in clinic and discuss further management   Also I value your feedback , so if you get a survey , please take the time to fill it out and thank you for choosing [facility]  Please call us at [phone] if you have persistent problems or have questions about your condition that have not been fully answered at this time.  Sincerely,  Vista Lawman, MD Gastroenterology and Hepatology  =============  Might recommend repeat EGD in 1 year given history of HP gastric large polyps  IDA eval by Heme , if no  improvement than capsule

## 2024-01-30 ENCOUNTER — Inpatient Hospital Stay: Payer: Medicare Other

## 2024-01-30 ENCOUNTER — Encounter (INDEPENDENT_AMBULATORY_CARE_PROVIDER_SITE_OTHER): Payer: Self-pay | Admitting: *Deleted

## 2024-01-30 VITALS — BP 133/59 | HR 61 | Temp 96.9°F | Resp 16 | Wt 155.7 lb

## 2024-01-30 DIAGNOSIS — D500 Iron deficiency anemia secondary to blood loss (chronic): Secondary | ICD-10-CM | POA: Diagnosis not present

## 2024-01-30 DIAGNOSIS — R5382 Chronic fatigue, unspecified: Secondary | ICD-10-CM | POA: Diagnosis not present

## 2024-01-30 DIAGNOSIS — Z79899 Other long term (current) drug therapy: Secondary | ICD-10-CM | POA: Diagnosis not present

## 2024-01-30 DIAGNOSIS — E611 Iron deficiency: Secondary | ICD-10-CM

## 2024-01-30 DIAGNOSIS — K317 Polyp of stomach and duodenum: Secondary | ICD-10-CM | POA: Diagnosis not present

## 2024-01-30 MED ORDER — ACETAMINOPHEN 325 MG PO TABS
650.0000 mg | ORAL_TABLET | Freq: Once | ORAL | Status: AC
  Administered 2024-01-30: 650 mg via ORAL
  Filled 2024-01-30: qty 2

## 2024-01-30 MED ORDER — CETIRIZINE HCL 10 MG PO TABS
10.0000 mg | ORAL_TABLET | Freq: Once | ORAL | Status: AC
  Administered 2024-01-30: 10 mg via ORAL
  Filled 2024-01-30: qty 1

## 2024-01-30 MED ORDER — SODIUM CHLORIDE 0.9 % IV SOLN: INTRAVENOUS | Status: DC

## 2024-01-30 MED ORDER — SODIUM CHLORIDE 0.9 % IV SOLN
510.0000 mg | Freq: Once | INTRAVENOUS | Status: AC
  Administered 2024-01-30: 510 mg via INTRAVENOUS
  Filled 2024-01-30: qty 510

## 2024-01-30 NOTE — Progress Notes (Signed)
 Patient presents today for iron infusion. Patient is in satisfactory condition with no new complaints voiced.  Vital signs are stable.  We will proceed with infusion per provider orders.    Peripheral IV started with good blood return pre and post infusion.  Feraheme 510 mg given today per MD orders. Tolerated infusion without adverse affects. Vital signs stable. No complaints at this time. Discharged from clinic ambulatory in stable condition. Alert and oriented x 3. F/U with [facility] as scheduled.

## 2024-01-30 NOTE — Patient Instructions (Signed)

## 2024-02-01 DIAGNOSIS — D649 Anemia, unspecified: Secondary | ICD-10-CM | POA: Diagnosis not present

## 2024-02-07 ENCOUNTER — Inpatient Hospital Stay: Payer: Medicare Other | Attending: Hematology

## 2024-02-07 VITALS — BP 147/62 | HR 71 | Temp 97.6°F | Resp 18

## 2024-02-07 DIAGNOSIS — E611 Iron deficiency: Secondary | ICD-10-CM | POA: Insufficient documentation

## 2024-02-07 MED ORDER — CETIRIZINE HCL 10 MG PO TABS
10.0000 mg | ORAL_TABLET | Freq: Once | ORAL | Status: AC
  Administered 2024-02-07: 10 mg via ORAL
  Filled 2024-02-07: qty 1

## 2024-02-07 MED ORDER — ACETAMINOPHEN 325 MG PO TABS
650.0000 mg | ORAL_TABLET | Freq: Once | ORAL | Status: AC
  Administered 2024-02-07: 650 mg via ORAL
  Filled 2024-02-07: qty 2

## 2024-02-07 MED ORDER — SODIUM CHLORIDE 0.9 % IV SOLN
510.0000 mg | Freq: Once | INTRAVENOUS | Status: AC
  Administered 2024-02-07: 510 mg via INTRAVENOUS
  Filled 2024-02-07: qty 510

## 2024-02-07 MED ORDER — SODIUM CHLORIDE 0.9 % IV SOLN: INTRAVENOUS | Status: DC

## 2024-02-07 NOTE — Patient Instructions (Signed)
 [facility] [place] - A DEPT OF [facility]  Discharge Instructions: Thank you for choosing [facility] to provide your oncology and hematology care.  If you have a lab appointment with the Cancer Center - please note that after April 8th, 2024, all labs will be drawn in the cancer center.  You do not have to check in or register with the main entrance as you have in the past but will complete your check-in in the cancer center.  Wear comfortable clothing and clothing appropriate for easy access to any Portacath or PICC line.   We strive to give you quality time with your provider. You may need to reschedule your appointment if you arrive late (15 or more minutes).  Arriving late affects you and other patients whose appointments are after yours.  Also, if you miss three or more appointments without notifying the office, you may be dismissed from the clinic at the provider's discretion.      For prescription refill requests, have your pharmacy contact our office and allow 72 hours for refills to be completed.    Today you received the following chemotherapy and/or immunotherapy agents Feraheme. Ferumoxytol Injection What is this medication? FERUMOXYTOL (FER ue MOX i tol) treats low levels of iron in your body (iron deficiency anemia). Iron is a mineral that plays an important role in making red blood cells, which carry oxygen from your lungs to the rest of your body. This medicine may be used for other purposes; ask your health care provider or pharmacist if you have questions. COMMON BRAND NAME(S): Feraheme What should I tell my care team before I take this medication? They need to know if you have any of these conditions: Anemia not caused by low iron levels High levels of iron in the blood Magnetic resonance imaging (MRI) test scheduled An unusual or allergic reaction to iron, other medications, foods, dyes, or preservatives Pregnant or trying to get  pregnant Breastfeeding How should I use this medication? This medication is injected into a vein. It is given by your care team in a hospital or clinic setting. Talk to your care team the use of this medication in children. Special care may be needed. Overdosage: If you think you have taken too much of this medicine contact a poison control center or emergency room at once. NOTE: This medicine is only for you. Do not share this medicine with others. What if I miss a dose? It is important not to miss your dose. Call your care team if you are unable to keep an appointment. What may interact with this medication? Other iron products This list may not describe all possible interactions. Give your health care provider a list of all the medicines, herbs, non-prescription drugs, or dietary supplements you use. Also tell them if you smoke, drink alcohol, or use illegal drugs. Some items may interact with your medicine. What should I watch for while using this medication? Visit your care team for regular checks on your progress. Tell your care team if your symptoms do not start to get better or if they get worse. You may need blood work done while you are taking this medication. You may need to eat more foods that contain iron. Talk to your care team. Foods that contain iron include whole grains or cereals, dried fruits, beans, peas, leafy green vegetables, and organ meats (liver, kidney). What side effects may I notice from receiving this medication? Side effects that  you should report to your care team as soon as possible: Allergic reactions--skin rash, itching, hives, swelling of the face, lips, tongue, or throat Low blood pressure--dizziness, feeling faint or lightheaded, blurry vision Shortness of breath Side effects that usually do not require medical attention (report to your care team if they continue or are bothersome): Flushing Headache Joint pain Muscle pain Nausea Pain, redness, or  irritation at injection site This list may not describe all possible side effects. Call your doctor for medical advice about side effects. You may report side effects to FDA at 1-800-FDA-1088. Where should I keep my medication? This medication is given in a hospital or clinic. It will not be stored at home. NOTE: This sheet is a summary. It may not cover all possible information. If you have questions about this medicine, talk to your doctor, pharmacist, or health care provider.  2024 Elsevier/Gold Standard (2023-07-11 00:00:00)      To help prevent nausea and vomiting after your treatment, we encourage you to take your nausea medication as directed.  BELOW ARE SYMPTOMS THAT SHOULD BE REPORTED IMMEDIATELY: *FEVER GREATER THAN 100.4 F (38 C) OR HIGHER *CHILLS OR SWEATING *NAUSEA AND VOMITING THAT IS NOT CONTROLLED WITH YOUR NAUSEA MEDICATION *UNUSUAL SHORTNESS OF BREATH *UNUSUAL BRUISING OR BLEEDING *URINARY PROBLEMS (pain or burning when urinating, or frequent urination) *BOWEL PROBLEMS (unusual diarrhea, constipation, pain near the anus) TENDERNESS IN MOUTH AND THROAT WITH OR WITHOUT PRESENCE OF ULCERS (sore throat, sores in mouth, or a toothache) UNUSUAL RASH, SWELLING OR PAIN  UNUSUAL VAGINAL DISCHARGE OR ITCHING   Items with * indicate a potential emergency and should be followed up as soon as possible or go to the Emergency Department if any problems should occur.  Please show the CHEMOTHERAPY ALERT CARD or IMMUNOTHERAPY ALERT CARD at check-in to the Emergency Department and triage nurse.  Should you have questions after your visit or need to cancel or reschedule your appointment, please contact [facility] [place] - A DEPT OF Eligha Bridegroom [facility] [phone]  and follow the prompts.  Office hours are 8:00 a.m. to 4:30 p.m. Monday - Friday. Please note that voicemails left after 4:00 p.m. may not be returned until the following business day.  We are closed weekends  and major holidays. You have access to a nurse at all times for urgent questions. Please call the main number to the clinic [phone] and follow the prompts.  For any non-urgent questions, you may also contact your provider using MyChart. We now offer e-Visits for anyone 67 and older to request care online for non-urgent symptoms. For details visit [email or web address].   Also download the MyChart app! Go to the app store, search "MyChart", open the app, select [place], and log in with your MyChart username and password.

## 2024-02-07 NOTE — Progress Notes (Signed)
 Patient presents today for Feraheme infusion. Patient denies any side effects related to her last treatment. MAR reviewed and updated.   Feraheme given today per MD orders. Tolerated infusion without adverse affects. Vital signs stable. No complaints at this time. Discharged from clinic ambulatory in stable condition. Alert and oriented x 3. F/U with [facility] as scheduled.

## 2024-02-20 ENCOUNTER — Encounter (INDEPENDENT_AMBULATORY_CARE_PROVIDER_SITE_OTHER): Payer: Self-pay | Admitting: Gastroenterology

## 2024-02-20 ENCOUNTER — Ambulatory Visit (INDEPENDENT_AMBULATORY_CARE_PROVIDER_SITE_OTHER): Payer: Medicare Other | Admitting: Gastroenterology

## 2024-02-20 VITALS — BP 108/74 | HR 89 | Temp 97.5°F | Ht 62.0 in | Wt 156.2 lb

## 2024-02-20 DIAGNOSIS — E538 Deficiency of other specified B group vitamins: Secondary | ICD-10-CM | POA: Diagnosis not present

## 2024-02-20 DIAGNOSIS — K317 Polyp of stomach and duodenum: Secondary | ICD-10-CM

## 2024-02-20 DIAGNOSIS — D509 Iron deficiency anemia, unspecified: Secondary | ICD-10-CM

## 2024-02-20 DIAGNOSIS — D500 Iron deficiency anemia secondary to blood loss (chronic): Secondary | ICD-10-CM

## 2024-02-20 NOTE — Progress Notes (Signed)
 Betty Wright , M.D. Gastroenterology & Hepatology [facility] [address] [place], [place] [address] Primary Care Physician: Donetta Potts, MD [address] [place] [address]  Chief Complaint:  iron deficiency anemia, vitamin B12 deficiency and multiple hyperplastic gastric polyps  History of Present Illness: Betty Wright is a 68 y.o. female with rheumatoid arthritis, fibromyalgia, diabetes on metformin who presents for follow up  of iron deficiency anemia, vitamin B12 deficiency and multiple hyperplastic gastric polyps  Patient [facility] performed [facility] and are scanned under media.  This was done only after 1 iron infusion  Patient had undergone upper endoscopy last month with 28 gastric polyps removed but this time they all reported as fundic gland polyps unlike hyperplastic polyps which were removed last by [facility]   Patient had seen hematology for iron deficiency and underwent 2 iron infusions.  She is feeling much stronger and energetic from her baseline and continues to take iron supplementation every other day  Last EGD: 01/2024  - Normal esophagus. - 28 gastric polyps. Resected and retrieved. via [facility] - Four gastric polyps. Resected and retrieved. - Erythematous mucosa in the antrum. Biopsied. - Normal duodenal bulb and second portion of the duodenum. Biopsied.  A. SMALL BOWEL, BIOPSY:  - Small intestinal mucosa with no specific histopathologic changes  - Negative for increased intraepithelial lymphocytes or villous  architectural changes    B. STOMACH, POLYPECTOMY:  - Fundic gland polyp(s)  - Negative for dysplasia    C. STOMACH, BIOPSY:  - Gastric antral and oxyntic mucosa with mild nonspecific reactive  gastropathy  - Helicobacter pylori-like organisms are not identified on routine HE  stain    11/15/2023 follow-up EGD performed by Dr. Lynden Ang    Path:  Hyperplastic  polyps Negative for intestinal metaplasia dysplasia or malignancy  Last Colonoscopy:11/2023    Suggested repeat 10 years  FHx: UNKNOWN as patient was adopted  Social: neg smoking, alcohol or illicit drug use Surgical: Partial hysterectomy  Negative gastric urease test for H. pylori 11/15/2023 Labs from 02/01/2024 hemoglobin 11.1 MCV 75  Labs 01/2024 ferritin 14 iron saturation 7 hemoglobin 11.3 improved from 10.8.  PLT:[id number]  08/2023 iron saturation 7% ferritin 19 vitamin B12 312 (low)  folate more than 20 Hemoglobin 10.8 MCV 77  Vitamin B12 have normalized and negative blood work for pernicious anemia with negative intrinsic factor antibody antiparietal antibody Negative celiac serologies   Past Medical History: Past Medical History:  Diagnosis Date   Diabetes (HCC)    Fibromyalgia    Hypertension    Osteopenia    Rheumatoid arthritis (HCC)     Past Surgical History: Past Surgical History:  Procedure Laterality Date   BIOPSY  01/23/2024   Procedure: BIOPSY;  Surgeon: Franky Macho, MD;  [facility];  Service: Endoscopy;;   BREAST EXCISIONAL BIOPSY Left    benign   BREAST SURGERY Left    lumpectomy    CESAREAN SECTION     times two   COLONOSCOPY     ESOPHAGOGASTRODUODENOSCOPY     ESOPHAGOGASTRODUODENOSCOPY (EGD) WITH PROPOFOL N/A 01/23/2024   Procedure: ESOPHAGOGASTRODUODENOSCOPY (EGD) WITH PROPOFOL;  Surgeon: Franky Macho, MD;  Location: [facility];  Service: Endoscopy;  Laterality: N/A;  7:30AM;ASA 1-2   PARTIAL HYSTERECTOMY     POLYPECTOMY  01/23/2024   Procedure: POLYPECTOMY INTESTINAL;  Surgeon: Franky Macho, MD;  Location: [facility];  Service: Endoscopy;;   TUBAL LIGATION  WISDOM TOOTH EXTRACTION      Family History: Family History  Adopted: Yes  Problem Relation Age of Onset   Healthy Daughter    Healthy Son    Breast cancer Neg Hx     Social History: Social History   Tobacco Use  Smoking Status Never    Passive exposure: Past  Smokeless Tobacco Never   Social History   Substance and Sexual Activity  Alcohol Use Never   Social History   Substance and Sexual Activity  Drug Use Never    Allergies: Allergies  Allergen Reactions   Erythromycin    Flonase [Fluticasone]    Influenza A (H1n1) Monovalent Vaccine     She wasn't sure which one but cant take the flu shot - arm became swollen.   Olmesartan     Muscle spasms   Valsartan     Muscle spasms   Penicillins Rash    Medications: Current Outpatient Medications  Medication Sig Dispense Refill   amLODipine (NORVASC) 5 MG tablet Take 5 mg by mouth daily.     cyanocobalamin 1000 MCG tablet Take 1,000 mcg by mouth daily.     ferrous gluconate (FERGON) 324 MG tablet Take by mouth.     glipiZIDE (GLUCOTROL) 5 MG tablet Take by mouth. 15 mg daily     lansoprazole (PREVACID) 30 MG capsule Take 30 mg by mouth daily at 12 noon. Pt unsure of dose     levocetirizine (XYZAL) 5 MG tablet Take 15 mg by mouth every evening.     metFORMIN (GLUCOPHAGE-XR) 500 MG 24 hr tablet Take by mouth. 1000 mg bid     metoprolol succinate (TOPROL-XL) 25 MG 24 hr tablet Take 25 mg by mouth daily.     Multiple Vitamin (MULTIVITAMIN) tablet Take 1 tablet by mouth daily.     TART CHERRY PO Take by mouth daily at 6 (six) AM.     triamterene-hydrochlorothiazide (DYAZIDE) 37.5-25 MG capsule Take 1 capsule by mouth as needed.     No current facility-administered medications for this visit.    Review of Systems: GENERAL: negative for malaise, night sweats HEENT: No changes in hearing or vision, no nose bleeds or other nasal problems. NECK: Negative for lumps, goiter, pain and significant neck swelling RESPIRATORY: Negative for cough, wheezing CARDIOVASCULAR: Negative for chest pain, leg swelling, palpitations, orthopnea GI: SEE HPI MUSCULOSKELETAL: Negative for joint pain or swelling, back pain, and muscle pain. SKIN: Negative for lesions, rash HEMATOLOGY  Negative for prolonged bleeding, bruising easily, and swollen nodes. ENDOCRINE: Negative for cold or heat intolerance, polyuria, polydipsia and goiter. NEURO: negative for tremor, gait imbalance, syncope and seizures. The remainder of the review of systems is noncontributory.   Physical Exam: BP 108/74 (BP Location: Left Arm, Patient Position: Sitting, Cuff Size: Normal)   Pulse 89   Temp (!) 97.5 F (36.4 C) (Temporal)   Ht 5\' 2"  (1.575 m)   Wt 156 lb 3.2 oz (70.9 kg)   BMI 28.57 kg/m  GENERAL: The patient is AO x3, in no acute distress. HEENT: Head is normocephalic and atraumatic. EOMI are intact. Mouth is well hydrated and without lesions. NECK: Supple. No masses LUNGS: Clear to auscultation. No presence of rhonchi/wheezing/rales. Adequate chest expansion HEART: RRR, normal s1 and s2. ABDOMEN: Soft, nontender, no guarding, no peritoneal signs, and nondistended. BS +. No masses.   Imaging/Labs: as above     Latest Ref Rng & Units 01/09/2024    3:51 PM 02/28/2021    1:53 PM  CBC  WBC 3.4 - 10.8 x10E3/uL 8.8  8.3   Hemoglobin 11.1 - 15.9 g/dL 09.8  11.9   Hematocrit 34.0 - 46.6 % 34.6  35.5   Platelets 150 - 450 x10E3/uL 433  353    Lab Results  Component Value Date   IRON 27 01/09/2024   TIBC 387 01/09/2024   FERRITIN 14 (L) 01/09/2024    I personally reviewed and interpreted the available labs, imaging and endoscopic files.  Impression and Plan: Betty Wright is a 68 y.o. female with rheumatoid arthritis, fibromyalgia, diabetes on metformin who presents for follow up  of iron deficiency anemia, vitamin B12 deficiency and multiple hyperplastic gastric polyps  #Hyperplastic gastric polyps #Iron deficiency anemia   Patient had upper endoscopy and colonoscopy by Dr.CATHY (2024).  Colonoscopy was essentially negative and upper endoscopy reportedly had multiple gastric polyps status post hot snare found to be hyperplastic polyp. Hyperplastic polyp in the stomach are  not necessarily considered benign.  Patient had a follow-up upper endoscopy (01/2024)  by myself with removal of 28 gastric polyps which are reported to be fundic gland polyps. Patient hemoglobin is improving after iron infusion , clinically feeling less fatigue but I do not see any recent iron profile to assess response to infusion   Patient has upcoming lab appointment and follow-up with hematology  If patient iron profile does not improve in next few months we will plan for small bowel capsule in future  Recommend surveillance repeat EGD in 1 year given history of HP gastric large polyps and family history  Suggested repeat colonoscopy 10 years from 2024  All questions were answered.      Betty Lawman, MD Gastroenterology and Hepatology [facility]   This chart has been completed using [facility] Dictation software, and while attempts have been made to ensure accuracy , certain words and phrases may not be transcribed as intended

## 2024-02-20 NOTE — Patient Instructions (Signed)
 It was very nice to meet you today, as dicussed with will plan for the following :  1) Continue iron supplementation  2) Follow up with hematology

## 2024-03-05 ENCOUNTER — Inpatient Hospital Stay: Payer: Medicare Other | Attending: Oncology

## 2024-03-05 DIAGNOSIS — E611 Iron deficiency: Secondary | ICD-10-CM | POA: Diagnosis not present

## 2024-03-05 DIAGNOSIS — K317 Polyp of stomach and duodenum: Secondary | ICD-10-CM | POA: Diagnosis not present

## 2024-03-05 DIAGNOSIS — Z79899 Other long term (current) drug therapy: Secondary | ICD-10-CM | POA: Insufficient documentation

## 2024-03-05 LAB — COMPREHENSIVE METABOLIC PANEL WITH GFR
ALT: 17 U/L (ref 0–44)
AST: 23 U/L (ref 15–41)
Albumin: 4 g/dL (ref 3.5–5.0)
Alkaline Phosphatase: 68 U/L (ref 38–126)
Anion gap: 14 (ref 5–15)
BUN: 22 mg/dL (ref 8–23)
CO2: 23 mmol/L (ref 22–32)
Calcium: 9.4 mg/dL (ref 8.9–10.3)
Chloride: 99 mmol/L (ref 98–111)
Creatinine, Ser: 1.11 mg/dL — ABNORMAL HIGH (ref 0.44–1.00)
GFR, Estimated: 54 mL/min — ABNORMAL LOW (ref >60)
Glucose, Bld: 161 mg/dL — ABNORMAL HIGH (ref 70–99)
Potassium: 4.2 mmol/L (ref 3.5–5.1)
Sodium: 136 mmol/L (ref 135–145)
Total Bilirubin: 0.4 mg/dL (ref 0.0–1.2)
Total Protein: 7.6 g/dL (ref 6.5–8.1)

## 2024-03-05 LAB — CBC WITH DIFFERENTIAL/PLATELET
Abs Immature Granulocytes: 0.02 10*3/uL (ref 0.00–0.07)
Basophils Absolute: 0.1 10*3/uL (ref 0.0–0.1)
Basophils Relative: 1 %
Eosinophils Absolute: 1.4 10*3/uL — ABNORMAL HIGH (ref 0.0–0.5)
Eosinophils Relative: 16 %
HCT: 44.1 % (ref 36.0–46.0)
Hemoglobin: 14 g/dL (ref 12.0–15.0)
Immature Granulocytes: 0 %
Lymphocytes Relative: 20 %
Lymphs Abs: 1.7 10*3/uL (ref 0.7–4.0)
MCH: 27.8 pg (ref 26.0–34.0)
MCHC: 31.7 g/dL (ref 30.0–36.0)
MCV: 87.5 fL (ref 80.0–100.0)
Monocytes Absolute: 0.5 10*3/uL (ref 0.1–1.0)
Monocytes Relative: 5 %
Neutro Abs: 4.8 10*3/uL (ref 1.7–7.7)
Neutrophils Relative %: 58 %
Platelets: 211 10*3/uL (ref 150–400)
RBC: 5.04 MIL/uL (ref 3.87–5.11)
RDW: 23.7 % — ABNORMAL HIGH (ref 11.5–15.5)
Smear Review: ADEQUATE
WBC: 8.4 10*3/uL (ref 4.0–10.5)
nRBC: 0 % (ref 0.0–0.2)

## 2024-03-05 LAB — FOLATE: Folate: 14.1 ng/mL (ref >5.9)

## 2024-03-05 LAB — IRON AND TIBC
Iron: 99 ug/dL (ref 28–170)
Saturation Ratios: 30 % (ref 10.4–31.8)
TIBC: 328 ug/dL (ref 250–450)
UIBC: 229 ug/dL

## 2024-03-05 LAB — FERRITIN: Ferritin: 135 ng/mL (ref 11–307)

## 2024-03-05 LAB — VITAMIN B12: Vitamin B-12: 551 pg/mL (ref 180–914)

## 2024-03-12 ENCOUNTER — Inpatient Hospital Stay: Payer: Medicare Other | Admitting: Oncology

## 2024-03-12 VITALS — BP 127/72 | HR 69 | Temp 98.2°F | Resp 18 | Ht 62.0 in | Wt 157.0 lb

## 2024-03-12 DIAGNOSIS — E611 Iron deficiency: Secondary | ICD-10-CM | POA: Diagnosis not present

## 2024-03-12 DIAGNOSIS — K317 Polyp of stomach and duodenum: Secondary | ICD-10-CM

## 2024-03-12 DIAGNOSIS — D500 Iron deficiency anemia secondary to blood loss (chronic): Secondary | ICD-10-CM

## 2024-03-12 DIAGNOSIS — Z79899 Other long term (current) drug therapy: Secondary | ICD-10-CM | POA: Diagnosis not present

## 2024-03-12 NOTE — Assessment & Plan Note (Signed)
 Likely secondary to previous gastric polyp bleeding. Ferritin and iron saturation levels improved significantly.  Patient continues to be not anemic and is symptomatically much better now.  -Continue Slow FE every other day. - Re-evaluate iron levels in three months. - Discontinue oral iron if levels stable. - Consider IV iron if levels drop and anemia recurs.  Return to clinic in 3 months with labs

## 2024-03-12 NOTE — Progress Notes (Signed)
 [facility]  HEMATOLOGY FOLLOW-UP VISIT  Donetta Potts, MD  REASON FOR FOLLOW-UP: Iron deficiency anemia  ASSESSMENT & PLAN:  Patient is a 68 y.o. female following for iron deficiency anemia  Iron deficiency Likely secondary to previous gastric polyp bleeding. Ferritin and iron saturation levels improved significantly.  Patient continues to be not anemic and is symptomatically much better now.  -Continue Slow FE every other day. - Re-evaluate iron levels in three months. - Discontinue oral iron if levels stable. - Consider IV iron if levels drop and anemia recurs.  Return to clinic in 3 months with labs   Multiple gastric polyps Multiple gastric polyps on recent and scoping.  No dysplasia.  -Continue to follow with Dr. Tasia Catchings for further care and frequent endoscopies.   Orders Placed This Encounter  Procedures   Ferritin    Standing Status:   Future    Expected Date:   06/09/2024    Expiration Date:   03/12/2025   Folate    Standing Status:   Future    Expected Date:   06/09/2024    Expiration Date:   03/12/2025   Vitamin B12    Standing Status:   Future    Expected Date:   06/09/2024    Expiration Date:   03/12/2025   CBC with Differential/Platelet    Standing Status:   Future    Expected Date:   06/09/2024    Expiration Date:   03/12/2025   Comprehensive metabolic panel with GFR    Standing Status:   Future    Expected Date:   06/09/2024    Expiration Date:   03/12/2025   Iron and TIBC    Standing Status:   Future    Expected Date:   06/09/2024    Expiration Date:   03/12/2025    The total time spent in the appointment was 20 minutes encounter with patients including review of chart and various tests results, discussions about plan of care and coordination of care plan   All questions were answered. The patient knows to call the clinic with any problems, questions or concerns. No barriers to learning was detected.  Cindie Crumbly,  MD 4/9/20251:17 PM   SUMMARY OF HEMATOLOGIC HISTORY: Iron deficiency anemia likely secondary to GI bleed - S/p IV iron fusion Feraheme X 2 doses: 01/30/2024 and 02/07/2024. - Recent endoscopy on 01/23/2024: Showed 28 gastric polyps.  At least 1 hyperplastic polyp. - Last colonoscopy 11/23/2023: Normal.  No evidence of bleeding.   INTERVAL HISTORY: SULLY MANZI 68 y.o. female following for iron deficiency anemia.  She is following after IV iron infusions.She reports feeling significantly better after the second infusion, although she feels the effects may have slightly waned. She denies any current blood in her stools. She has been taking iron pills and has noticed an improvement in her energy levels.  Overall, patient has been feeling well.  She has no complaints today.  I have reviewed the past medical history, past surgical history, social history and family history with the patient   ALLERGIES:  is allergic to erythromycin, flonase [fluticasone], influenza a (h1n1) monovalent vaccine, olmesartan, valsartan, and penicillins.  MEDICATIONS:  Current Outpatient Medications  Medication Sig Dispense Refill   amLODipine (NORVASC) 5 MG tablet Take 5 mg by mouth daily.     cyanocobalamin 1000 MCG tablet Take 1,000 mcg by mouth daily.     ferrous gluconate (FERGON) 324 MG tablet Take by mouth.  glipiZIDE (GLUCOTROL) 5 MG tablet Take by mouth. 15 mg daily     lansoprazole (PREVACID) 30 MG capsule Take 30 mg by mouth daily at 12 noon. Pt unsure of dose     levocetirizine (XYZAL) 5 MG tablet Take 15 mg by mouth every evening.     metFORMIN (GLUCOPHAGE-XR) 500 MG 24 hr tablet Take by mouth. 1000 mg bid     metoprolol succinate (TOPROL-XL) 25 MG 24 hr tablet Take 25 mg by mouth daily.     Multiple Vitamin (MULTIVITAMIN) tablet Take 1 tablet by mouth daily.     TART CHERRY PO Take by mouth daily at 6 (six) AM.     triamterene-hydrochlorothiazide (DYAZIDE) 37.5-25 MG capsule Take 1 capsule by  mouth as needed.     No current facility-administered medications for this visit.     REVIEW OF SYSTEMS:   Constitutional: Denies fevers, chills or night sweats Eyes: Denies blurriness of vision Ears, nose, mouth, throat, and face: Denies mucositis or sore throat Respiratory: Denies cough, dyspnea or wheezes Cardiovascular: Denies palpitation, chest discomfort or lower extremity swelling Gastrointestinal:  Denies nausea, heartburn or change in bowel habits Skin: Denies abnormal skin rashes Lymphatics: Denies new lymphadenopathy or easy bruising Neurological:Denies numbness, tingling or new weaknesses Behavioral/Psych: Mood is stable, no new changes  All other systems were reviewed with the patient and are negative.  PHYSICAL EXAMINATION:   Vitals:   03/12/24 1300  BP: 127/72  Pulse: 69  Resp: 18  Temp: 98.2 F (36.8 C)  SpO2: 100%    GENERAL:alert, no distress and comfortable SKIN: skin color, texture, turgor are normal, no rashes or significant lesions LUNGS: clear to auscultation and percussion with normal breathing effort HEART: regular rate & rhythm and no murmurs and no lower extremity edema ABDOMEN:abdomen soft, non-tender and normal bowel sounds Musculoskeletal:no cyanosis of digits and no clubbing  NEURO: alert & oriented x 3 with fluent speech  LABORATORY DATA:  I have reviewed the data as listed  Lab Results  Component Value Date   WBC 8.4 03/05/2024   NEUTROABS 4.8 03/05/2024   HGB 14.0 03/05/2024   HCT 44.1 03/05/2024   MCV 87.5 03/05/2024   PLT 211 03/05/2024       Chemistry      Component Value Date/Time   NA 136 03/05/2024 1336   K 4.2 03/05/2024 1336   CL 99 03/05/2024 1336   CO2 23 03/05/2024 1336   BUN 22 03/05/2024 1336   CREATININE 1.11 (H) 03/05/2024 1336   CREATININE 1.04 (H) 02/25/2021 0000      Component Value Date/Time   CALCIUM 9.4 03/05/2024 1336   ALKPHOS 68 03/05/2024 1336   AST 23 03/05/2024 1336   ALT 17 03/05/2024  1336   BILITOT 0.4 03/05/2024 1336       Latest Reference Range & Units 03/05/24 13:36 03/05/24 13:37  Iron 28 - 170 ug/dL 99   UIBC ug/dL 161   TIBC 096 - 045 ug/dL 409   Saturation Ratios 10.4 - 31.8 % 30   Ferritin 11 - 307 ng/mL 135   Folate >5.9 ng/mL  14.1  Vitamin B12 180 - 914 pg/mL 551    Colonoscopy: 12/24 (Care everywhere) Impression:            - The entire examined colon is normal on direct and retroflexion views.  - No specimens collected.   Endoscopy: 01/23/24: Impression:  - Normal esophagus.  - 28 gastric polyps. Resected and retrieved. via [facility]  -  Four gastric polyps. Resected and retrieved.  - Erythematous mucosa in the antrum. Biopsied.  - Normal duodenal bulb and second portion of the duodenum. Biopsied.   Pathology: FINAL MICROSCOPIC DIAGNOSIS:   A. SMALL BOWEL, BIOPSY:  - Small intestinal mucosa with no specific histopathologic changes  - Negative for increased intraepithelial lymphocytes or villous architectural changes   B. STOMACH, POLYPECTOMY:  - Fundic gland polyp(s)  - Negative for dysplasia   C. STOMACH, BIOPSY:  - Gastric antral and oxyntic mucosa with mild nonspecific reactive gastropathy  - Helicobacter pylori-like organisms are not identified on routine HE stain

## 2024-03-12 NOTE — Patient Instructions (Signed)
 VISIT SUMMARY:  You came in today for a follow-up after receiving iron infusions. You reported feeling significantly better after the second infusion, although you feel the effects may have slightly waned. You have been taking iron pills and have noticed an improvement in your energy levels. You have a history of anemia, which has been resolved with the iron infusions and iron pills.  YOUR PLAN:  -IRON DEFICIENCY ANEMIA: Iron deficiency anemia occurs when your body doesn't have enough iron to produce adequate levels of hemoglobin, which is necessary for carrying oxygen in the blood. Your anemia has resolved after the iron infusions. Your ferritin and iron saturation levels have improved significantly, and your hemoglobin and iron levels are stable. You should continue taking oral iron supplements for three months. We will re-evaluate your iron levels in three months, and if they are stable, you can discontinue the oral iron. If your levels drop and anemia recurs, we may consider IV iron.  -GASTRIC POLYPS: Gastric polyps are growths that form on the lining of your stomach. You have 32 gastric polyps, and previous removal showed potential malignancy, so continued monitoring is required. Please continue follow-up with Dr. Tasia Catchings and plan for a repeat endoscopy in one year.  INSTRUCTIONS:  Please follow up in three months to re-evaluate your iron levels. Continue your follow-up with Dr. Tasia Catchings for your gastric polyps and plan for a repeat endoscopy in one year.

## 2024-03-12 NOTE — Assessment & Plan Note (Signed)
 Multiple gastric polyps on recent and scoping.  No dysplasia.  -Continue to follow with Dr. Tasia Catchings for further care and frequent endoscopies.

## 2024-03-18 DIAGNOSIS — D529 Folate deficiency anemia, unspecified: Secondary | ICD-10-CM | POA: Diagnosis not present

## 2024-03-18 DIAGNOSIS — E1165 Type 2 diabetes mellitus with hyperglycemia: Secondary | ICD-10-CM | POA: Diagnosis not present

## 2024-03-18 DIAGNOSIS — I10 Essential (primary) hypertension: Secondary | ICD-10-CM | POA: Diagnosis not present

## 2024-03-18 DIAGNOSIS — Z131 Encounter for screening for diabetes mellitus: Secondary | ICD-10-CM | POA: Diagnosis not present

## 2024-03-18 DIAGNOSIS — Z13220 Encounter for screening for lipoid disorders: Secondary | ICD-10-CM | POA: Diagnosis not present

## 2024-03-18 LAB — LAB REPORT - SCANNED
A1c: 6.6
EGFR: 43.5

## 2024-03-25 DIAGNOSIS — K219 Gastro-esophageal reflux disease without esophagitis: Secondary | ICD-10-CM | POA: Diagnosis not present

## 2024-03-25 DIAGNOSIS — E1165 Type 2 diabetes mellitus with hyperglycemia: Secondary | ICD-10-CM | POA: Diagnosis not present

## 2024-03-25 DIAGNOSIS — I10 Essential (primary) hypertension: Secondary | ICD-10-CM | POA: Diagnosis not present

## 2024-03-25 DIAGNOSIS — M069 Rheumatoid arthritis, unspecified: Secondary | ICD-10-CM | POA: Diagnosis not present

## 2024-03-25 DIAGNOSIS — Z6828 Body mass index (BMI) 28.0-28.9, adult: Secondary | ICD-10-CM | POA: Diagnosis not present

## 2024-04-08 ENCOUNTER — Encounter: Payer: Self-pay | Admitting: Oncology

## 2024-05-01 DIAGNOSIS — D649 Anemia, unspecified: Secondary | ICD-10-CM | POA: Diagnosis not present

## 2024-05-01 DIAGNOSIS — I10 Essential (primary) hypertension: Secondary | ICD-10-CM | POA: Diagnosis not present

## 2024-05-01 DIAGNOSIS — D529 Folate deficiency anemia, unspecified: Secondary | ICD-10-CM | POA: Diagnosis not present

## 2024-05-08 ENCOUNTER — Ambulatory Visit: Payer: Self-pay | Admitting: Nurse Practitioner

## 2024-05-08 ENCOUNTER — Encounter: Payer: Self-pay | Admitting: Nurse Practitioner

## 2024-05-08 VITALS — BP 108/74 | HR 70 | Ht 62.0 in | Wt 157.0 lb

## 2024-05-08 DIAGNOSIS — E1122 Type 2 diabetes mellitus with diabetic chronic kidney disease: Secondary | ICD-10-CM | POA: Diagnosis not present

## 2024-05-08 DIAGNOSIS — E782 Mixed hyperlipidemia: Secondary | ICD-10-CM

## 2024-05-08 DIAGNOSIS — I10 Essential (primary) hypertension: Secondary | ICD-10-CM

## 2024-05-08 DIAGNOSIS — E559 Vitamin D deficiency, unspecified: Secondary | ICD-10-CM

## 2024-05-08 DIAGNOSIS — Z7984 Long term (current) use of oral hypoglycemic drugs: Secondary | ICD-10-CM

## 2024-05-08 DIAGNOSIS — N1831 Chronic kidney disease, stage 3a: Secondary | ICD-10-CM | POA: Diagnosis not present

## 2024-05-08 LAB — POCT GLYCOSYLATED HEMOGLOBIN (HGB A1C): Hemoglobin A1C: 6.6 % — AB (ref 4.0–5.6)

## 2024-05-08 MED ORDER — METFORMIN HCL ER 500 MG PO TB24: 1000.0000 mg | ORAL_TABLET | Freq: Every day | ORAL | 3 refills | Status: DC

## 2024-05-08 MED ORDER — GLIPIZIDE ER 5 MG PO TB24: 5.0000 mg | ORAL_TABLET | Freq: Every day | ORAL | 3 refills | Status: DC

## 2024-05-08 NOTE — Progress Notes (Signed)
 Endocrinology Consult Note       05/08/2024, 10:58 AM   Subjective:    Patient ID: Betty Wright, female    DOB: [birth year].  Betty Wright is being seen in consultation for management of currently uncontrolled symptomatic diabetes requested by  Lauran Pollard, MD.   Past Medical History:  Diagnosis Date   Diabetes [facility])    Fibromyalgia    Hypertension    Osteopenia    Rheumatoid arthritis [facility])     Past Surgical History:  Procedure Laterality Date   BIOPSY  01/23/2024   Procedure: BIOPSY;  Surgeon: Hargis Lias, MD;  Location: [facility];  Service: Endoscopy;;   BREAST EXCISIONAL BIOPSY Left    benign   BREAST SURGERY Left    lumpectomy    CESAREAN SECTION     times two   COLONOSCOPY     ESOPHAGOGASTRODUODENOSCOPY     ESOPHAGOGASTRODUODENOSCOPY (EGD) WITH PROPOFOL  N/A 01/23/2024   Procedure: ESOPHAGOGASTRODUODENOSCOPY (EGD) WITH PROPOFOL ;  Surgeon: Hargis Lias, MD;  Location: [facility];  Service: Endoscopy;  Laterality: N/A;  7:30AM;ASA 1-2   PARTIAL HYSTERECTOMY     POLYPECTOMY  01/23/2024   Procedure: POLYPECTOMY INTESTINAL;  Surgeon: Hargis Lias, MD;  Location: [facility];  Service: Endoscopy;;   TUBAL LIGATION     WISDOM TOOTH EXTRACTION      Social History   Socioeconomic History   Marital status: Married    Spouse name: Not on file   Number of children: Not on file   Years of education: Not on file   Highest education level: Not on file  Occupational History   Not on file  Tobacco Use   Smoking status: Never    Passive exposure: Past   Smokeless tobacco: Never  Vaping Use   Vaping status: Never Used  Substance and Sexual Activity   Alcohol use: Never   Drug use: Never   Sexual activity: Not on file  Other Topics Concern   Not on file  Social History Narrative   3 level home with husband; R handed ; high school grad; 1 cup coffee every am;  exercise not much;    Social Drivers of Corporate investment banker Strain: Not on file  Food Insecurity: Not on file  Transportation Needs: Not on file  Physical Activity: Not on file  Stress: Not on file  Social Connections: Unknown (04/18/2022)   Received from [facility], [facility]   Social Network    Social Network: Not on file    Family History  Adopted: Yes  Problem Relation Age of Onset   Healthy Daughter    Healthy Son    Breast cancer Neg Hx     Outpatient Encounter Medications as of 05/08/2024  Medication Sig   amLODipine (NORVASC) 5 MG tablet Take 5 mg by mouth daily.   cyanocobalamin  1000 MCG tablet Take 1,000 mcg by mouth daily.   glipiZIDE (GLUCOTROL XL) 5 MG 24 hr tablet Take 1 tablet (5 mg total) by mouth daily with breakfast.   lansoprazole (PREVACID) 30 MG capsule Take 30 mg by mouth daily at 12 noon. Pt unsure of dose  levocetirizine (XYZAL) 5 MG tablet Take 15 mg by mouth every evening.   metoprolol succinate (TOPROL-XL) 25 MG 24 hr tablet Take 25 mg by mouth daily.   Multiple Vitamin (MULTIVITAMIN) tablet Take 1 tablet by mouth daily.   TART CHERRY PO Take by mouth daily at 6 (six) AM.   triamterene-hydrochlorothiazide (DYAZIDE) 37.5-25 MG capsule Take 1 capsule by mouth as needed.   [DISCONTINUED] glipiZIDE (GLUCOTROL) 5 MG tablet Take by mouth. 15 mg daily   [DISCONTINUED] metFORMIN (GLUCOPHAGE-XR) 500 MG 24 hr tablet Take by mouth. 1000 mg bid   metFORMIN (GLUCOPHAGE-XR) 500 MG 24 hr tablet Take 2 tablets (1,000 mg total) by mouth daily with breakfast. 1000 mg bid   [DISCONTINUED] ferrous gluconate (FERGON) 324 MG tablet Take by mouth. (Patient not taking: Reported on 05/08/2024)   No facility-administered encounter medications on file as of 05/08/2024.    ALLERGIES: Allergies  Allergen Reactions   Erythromycin    Flonase [Fluticasone]    Influenza A (H1n1) Monovalent Vaccine     She wasn't sure which one but cant take the flu shot - arm became  swollen.   Olmesartan     Muscle spasms   Valsartan     Muscle spasms   Penicillins Rash    VACCINATION STATUS: Immunization History  Administered Date(s) Administered   Moderna Sars-Covid-2 Vaccination 01/29/2020, 02/27/2020, 10/21/2020   Pneumococcal-Unspecified 08/29/2023    Diabetes She presents for her initial diabetic visit. She has type 2 diabetes mellitus. Onset time: Diagnosed at approx age of 68. Her disease course has been fluctuating. Hypoglycemia symptoms include nervousness/anxiousness, sweats and tremors. There are no diabetic associated symptoms. There are no hypoglycemic complications. Diabetic complications include nephropathy and retinopathy. Risk factors for coronary artery disease include diabetes mellitus, dyslipidemia, family history, hypertension and post-menopausal. Current diabetic treatment includes oral agent (dual therapy). She is compliant with treatment most of the time. Her weight is fluctuating minimally. She is following a generally unhealthy diet. When asked about meal planning, she reported none. She has not had a previous visit with a dietitian. She participates in exercise intermittently. Her home blood glucose trend is fluctuating dramatically. Her breakfast blood glucose range is generally 140-180 mg/dl. (She presents today for her consultation with her logs showing fluctuating glycemic profile with above target fasting and tight postprandial readings overall.  Her most recent A1c on 4/15 was 6.6%, improving from previous A1c of 7.7%.  She monitors glucose 1-2 times per day and sometimes more depending on how she is feeling.  She drinks coffee with artifical sweetener and creamer, propel flavored water , and 1 diet soda per day.  She admits she does not drink enough water .  She eats 3 meals per day and rarely snacks.  She does not engage in routine physical activity.  She is UTD on eye exam, has never seen podiatry in the past.  She notes her PCP wanted to  decrease her Metformin due to kidney impairment and thus adjusted her Glipizide which inadvertently caused some severe hypoglycemic episodes (in the 50s).) She does not see a podiatrist.Eye exam is current.     Review of systems  Constitutional: + Minimally fluctuating body weight, current Body mass index is 28.72 kg/m., no fatigue, no subjective hyperthermia, no subjective hypothermia Eyes: no blurry vision, no xerophthalmia ENT: no sore throat, no nodules palpated in throat, no dysphagia/odynophagia, no hoarseness Cardiovascular: no chest pain, no shortness of breath, no palpitations, no leg swelling Respiratory: no cough, no shortness of breath Gastrointestinal: no  nausea/vomiting/diarrhea Musculoskeletal: no muscle/joint aches Skin: no rashes, no hyperemia Neurological: no tremors, no numbness, no tingling, no dizziness Psychiatric: no depression, no anxiety  Objective:      BP 108/74 (BP Location: Left Arm, Patient Position: Sitting, Cuff Size: Large)   Pulse 70   Ht 5\' 2"  (1.575 m)   Wt 157 lb (71.2 kg)   BMI 28.72 kg/m   Wt Readings from Last 3 Encounters:  05/08/24 157 lb (71.2 kg)  03/12/24 157 lb (71.2 kg)  02/20/24 156 lb 3.2 oz (70.9 kg)     BP Readings from Last 3 Encounters:  05/08/24 108/74  03/12/24 127/72  02/20/24 108/74     Physical Exam- Limited  Constitutional:  Body mass index is 28.72 kg/m. , not in acute distress, normal state of mind Eyes:  EOMI, no exophthalmos Neck: Supple Cardiovascular: RRR, no murmurs, rubs, or gallops, no edema Respiratory: Adequate breathing efforts, no crackles, rales, rhonchi, or wheezing Musculoskeletal: no gross deformities, strength intact in all four extremities, no gross restriction of joint movements Skin:  no rashes, no hyperemia Neurological: no tremor with outstretched hands   Diabetic Foot Exam - Simple   No data filed      CMP ( most recent) CMP     Component Value Date/Time   NA 136  03/05/2024 1336   K 4.2 03/05/2024 1336   CL 99 03/05/2024 1336   CO2 23 03/05/2024 1336   GLUCOSE 161 (H) 03/05/2024 1336   BUN 22 03/05/2024 1336   BUN 14 12/19/2023 0000   CREATININE 1.11 (H) 03/05/2024 1336   CREATININE 1.04 (H) 02/25/2021 0000   CALCIUM 9.4 03/05/2024 1336   PROT 7.6 03/05/2024 1336   ALBUMIN 4.0 03/05/2024 1336   AST 23 03/05/2024 1336   ALT 17 03/05/2024 1336   ALKPHOS 68 03/05/2024 1336   BILITOT 0.4 03/05/2024 1336   EGFR 56.5 12/19/2023 0000   GFRNONAA 54 (L) 03/05/2024 1336   GFRNONAA 56 (L) 02/25/2021 0000     Diabetic Labs (most recent): Lab Results  Component Value Date   HGBA1C 6.6 (A) 05/08/2024   HGBA1C 7.7 12/19/2023     Lipid Panel ( most recent) Lipid Panel     Component Value Date/Time   TRIG 170 (A) 12/19/2023 0000   LDLCALC 131 12/19/2023 0000      Lab Results  Component Value Date   TSH 3.19 10/23/2023   TSH 1.92 08/21/2019           Assessment & Plan:   1) Type 2 diabetes mellitus with stage 3a chronic kidney disease, without long-term current use of insulin (HCC) (Primary)  She presents today for her consultation with her logs showing fluctuating glycemic profile with above target fasting and tight postprandial readings overall.  Her most recent A1c on 4/15 was 6.6%, improving from previous A1c of 7.7%.  She monitors glucose 1-2 times per day and sometimes more depending on how she is feeling.  She drinks coffee with artifical sweetener and creamer, propel flavored water , and 1 diet soda per day.  She admits she does not drink enough water .  She eats 3 meals per day and rarely snacks.  She does not engage in routine physical activity.  She is UTD on eye exam, has never seen podiatry in the past.  She notes her PCP wanted to decrease her Metformin due to kidney impairment and thus adjusted her Glipizide which inadvertently caused some severe hypoglycemic episodes (in the 50s).  Betty Raja  Wright has currently  uncontrolled symptomatic type 2 DM since 68 years of age, with most recent A1c of 6.6 %.   -Recent labs reviewed.  - I had a long discussion with her about the progressive nature of diabetes and the pathology behind its complications. -her diabetes is complicated by retinopathy and CKD stage 3a and she remains at a high risk for more acute and chronic complications which include CAD, CVA, CKD, retinopathy, and neuropathy. These are all discussed in detail with her.  The following Lifestyle Medicine recommendations according to American College of Lifestyle Medicine [facility]) were discussed and offered to patient and she agrees to start the journey:  A. Whole Foods, Plant-based plate comprising of fruits and vegetables, plant-based proteins, whole-grain carbohydrates was discussed in detail with the patient.   A list for source of those nutrients were also provided to the patient.  Patient will use only water  or unsweetened tea for hydration. B.  The need to stay away from risky substances including alcohol, smoking; obtaining 7 to 9 hours of restorative sleep, at least 150 minutes of moderate intensity exercise weekly, the importance of healthy social connections,  and stress reduction techniques were discussed. C.  A full color page of  Calorie density of various food groups per pound showing examples of each food groups was provided to the patient.  - I have counseled her on diet and weight management by adopting a carbohydrate restricted/protein rich diet. Patient is encouraged to switch to unprocessed or minimally processed complex starch and increased protein intake (animal or plant source), fruits, and vegetables. -  she is advised to stick to a routine mealtimes to eat 3 meals a day and avoid unnecessary snacks (to snack only to correct hypoglycemia).   - she acknowledges that there is a room for improvement in her food and drink choices. - Suggestion is made for her to avoid simple carbohydrates  from her diet including Cakes, Sweet Desserts, Ice Cream, Soda (diet and regular), Sweet Tea, Candies, Chips, Cookies, Store Bought Juices, Alcohol in Excess of 1-2 drinks a day, Artificial Sweeteners, Coffee Creamer, and "Sugar-free" Products. This will help patient to have more stable blood glucose profile and potentially avoid unintended weight gain.  - I have approached her with the following individualized plan to manage her diabetes and patient agrees:    -she is encouraged to start/continue monitoring glucose 2 times daily, before breakfast and before bed, and to call the clinic if she has readings less than 70 or above 300 for 3 tests in a row.  - she is warned not to take insulin without proper monitoring per orders. - Adjustment parameters are given to her for hypo and hyperglycemia in writing.  - she is advised to continue Metformin 1000 mg ER daily after breakfast, therapeutically suitable for patient.  Will keep an eye on kidney function and if it declines will consider reducing dose.  She is advised to lower her Glipizide to 5 mg XL daily at breakfast.  10 mg is likely causing dramatic fluctuations and liver dumping overnight.  - Specific targets for  A1c; LDL, HDL, and Triglycerides were discussed with the patient.  2) Blood Pressure /Hypertension:  her blood pressure is controlled to target.   she is advised to continue her current medications as prescribed by her PCP.  3) Lipids/Hyperlipidemia:    Review of her recent lipid panel from 12/19/23 showed uncontrolled LDL at 131 and elevated triglycerides of 131 .  she is not currently  on any lipid lowering medications.  4)  Weight/Diet:  her Body mass index is 28.72 kg/m.  -  she is NOT a candidate for major weight loss. I discussed with her the fact that loss of 5 - 10% of her  current body weight will have the most impact on her diabetes management.  Exercise, and detailed carbohydrates information provided  -  detailed on  discharge instructions.  5) Chronic Care/Health Maintenance: -she is not on ACEI/ARB or Statin medications and is encouraged to initiate and continue to follow up with Ophthalmology, Dentist, Podiatrist at least yearly or according to recommendations, and advised to stay away from smoking. I have recommended yearly flu vaccine and pneumonia vaccine at least every 5 years; moderate intensity exercise for up to 150 minutes weekly; and sleep for at least 7 hours a day.  - she is advised to maintain close follow up with Lauran Pollard, MD for primary care needs, as well as her other providers for optimal and coordinated care.   - Time spent in this patient care: 60 min, which was spent in counseling her about her diabetes and the rest reviewing her blood glucose logs, discussing her hypoglycemia and hyperglycemia episodes, reviewing her current and previous labs/studies (including abstraction from other facilities) and medications doses and developing a long term treatment plan based on the latest standards of care/guidelines; and documenting her care.    Please refer to Patient Instructions for Blood Glucose Monitoring and Insulin/Medications Dosing Guide" in media tab for additional information. Please also refer to "Patient Self Inventory" in the Media tab for reviewed elements of pertinent patient history.  Betty Raja Flett participated in the discussions, expressed understanding, and voiced agreement with the above plans.  All questions were answered to her satisfaction. she is encouraged to contact clinic should she have any questions or concerns prior to her return visit.     Follow up plan: - Return in about 4 months (around 09/07/2024) for Diabetes F/U with A1c in office, Previsit labs, Bring meter and logs.    Hulon Magic, [facility] [address] [place], [place] [address] Phone: [phone] Fax: [phone]  05/08/2024, 10:58 AM

## 2024-05-09 DIAGNOSIS — M069 Rheumatoid arthritis, unspecified: Secondary | ICD-10-CM | POA: Diagnosis not present

## 2024-05-09 DIAGNOSIS — E1165 Type 2 diabetes mellitus with hyperglycemia: Secondary | ICD-10-CM | POA: Diagnosis not present

## 2024-05-09 DIAGNOSIS — I10 Essential (primary) hypertension: Secondary | ICD-10-CM | POA: Diagnosis not present

## 2024-05-09 DIAGNOSIS — Z6828 Body mass index (BMI) 28.0-28.9, adult: Secondary | ICD-10-CM | POA: Diagnosis not present

## 2024-05-09 DIAGNOSIS — K219 Gastro-esophageal reflux disease without esophagitis: Secondary | ICD-10-CM | POA: Diagnosis not present

## 2024-06-04 ENCOUNTER — Inpatient Hospital Stay: Attending: Oncology

## 2024-06-04 DIAGNOSIS — D509 Iron deficiency anemia, unspecified: Secondary | ICD-10-CM | POA: Diagnosis not present

## 2024-06-04 DIAGNOSIS — D500 Iron deficiency anemia secondary to blood loss (chronic): Secondary | ICD-10-CM

## 2024-06-04 LAB — COMPREHENSIVE METABOLIC PANEL WITH GFR
ALT: 13 U/L (ref 0–44)
AST: 21 U/L (ref 15–41)
Albumin: 4 g/dL (ref 3.5–5.0)
Alkaline Phosphatase: 66 U/L (ref 38–126)
Anion gap: 14 (ref 5–15)
BUN: 20 mg/dL (ref 8–23)
CO2: 26 mmol/L (ref 22–32)
Calcium: 9.8 mg/dL (ref 8.9–10.3)
Chloride: 98 mmol/L (ref 98–111)
Creatinine, Ser: 1.05 mg/dL — ABNORMAL HIGH (ref 0.44–1.00)
GFR, Estimated: 58 mL/min — ABNORMAL LOW (ref >60)
Glucose, Bld: 122 mg/dL — ABNORMAL HIGH (ref 70–99)
Potassium: 4.7 mmol/L (ref 3.5–5.1)
Sodium: 138 mmol/L (ref 135–145)
Total Bilirubin: 0.6 mg/dL (ref 0.0–1.2)
Total Protein: 7.6 g/dL (ref 6.5–8.1)

## 2024-06-04 LAB — IRON AND TIBC
Iron: 111 ug/dL (ref 28–170)
Saturation Ratios: 33 % — ABNORMAL HIGH (ref 10.4–31.8)
TIBC: 334 ug/dL (ref 250–450)
UIBC: 223 ug/dL

## 2024-06-04 LAB — CBC WITH DIFFERENTIAL/PLATELET
Abs Immature Granulocytes: 0.03 10*3/uL (ref 0.00–0.07)
Basophils Absolute: 0.1 10*3/uL (ref 0.0–0.1)
Basophils Relative: 1 %
Eosinophils Absolute: 0.8 10*3/uL — ABNORMAL HIGH (ref 0.0–0.5)
Eosinophils Relative: 9 %
HCT: 45.2 % (ref 36.0–46.0)
Hemoglobin: 15.1 g/dL — ABNORMAL HIGH (ref 12.0–15.0)
Immature Granulocytes: 0 %
Lymphocytes Relative: 21 %
Lymphs Abs: 1.7 10*3/uL (ref 0.7–4.0)
MCH: 31.1 pg (ref 26.0–34.0)
MCHC: 33.4 g/dL (ref 30.0–36.0)
MCV: 93 fL (ref 80.0–100.0)
Monocytes Absolute: 0.5 10*3/uL (ref 0.1–1.0)
Monocytes Relative: 7 %
Neutro Abs: 5 10*3/uL (ref 1.7–7.7)
Neutrophils Relative %: 62 %
Platelets: 259 10*3/uL (ref 150–400)
RBC: 4.86 MIL/uL (ref 3.87–5.11)
RDW: 13.2 % (ref 11.5–15.5)
WBC: 8.1 10*3/uL (ref 4.0–10.5)
nRBC: 0 % (ref 0.0–0.2)

## 2024-06-04 LAB — FOLATE: Folate: 12.9 ng/mL (ref >5.9)

## 2024-06-04 LAB — FERRITIN: Ferritin: 31 ng/mL (ref 11–307)

## 2024-06-04 LAB — VITAMIN B12: Vitamin B-12: 451 pg/mL (ref 180–914)

## 2024-06-11 ENCOUNTER — Inpatient Hospital Stay: Admitting: Oncology

## 2024-06-11 VITALS — BP 143/57 | HR 68 | Temp 97.8°F | Resp 18 | Ht 62.0 in | Wt 155.0 lb

## 2024-06-11 DIAGNOSIS — E611 Iron deficiency: Secondary | ICD-10-CM | POA: Diagnosis not present

## 2024-06-11 DIAGNOSIS — K317 Polyp of stomach and duodenum: Secondary | ICD-10-CM

## 2024-06-11 DIAGNOSIS — D509 Iron deficiency anemia, unspecified: Secondary | ICD-10-CM | POA: Diagnosis not present

## 2024-06-11 NOTE — Progress Notes (Signed)
 [facility] at [facility]  HEMATOLOGY FOLLOW-UP VISIT  Trudy Vaughn FALCON, MD  REASON FOR FOLLOW-UP: Iron deficiency anemia  ASSESSMENT & PLAN:  Patient is a 68 y.o. female following for iron deficiency anemia  Iron deficiency Likely secondary to previous gastric polyp bleeding. Ferritin and iron saturation levels improved significantly.  Patient continues to be not anemic and is symptomatically much better now.  - Discontinue oral iron - Continue eating diet rich in protein and greens  No further hematological needs at this time.  Patient can continue to follow-up with primary care and recommended to reach out to us  with future questions or concerns  Multiple gastric polyps Multiple gastric polyps on recent and scoping.  No dysplasia.  -Continue to follow with Dr. Cinderella for further care and frequent endoscopies.    No orders of the defined types were placed in this encounter.   The total time spent in the appointment was 20 minutes encounter with patients including review of chart and various tests results, discussions about plan of care and coordination of care plan   All questions were answered. The patient knows to call the clinic with any problems, questions or concerns. No barriers to learning was detected.  Mickiel Dry, MD 7/9/202512:51 PM   SUMMARY OF HEMATOLOGIC HISTORY: Iron deficiency anemia likely secondary to GI bleed - S/p IV iron fusion Feraheme X 2 doses: 01/30/2024 and 02/07/2024. - Recent endoscopy on 01/23/2024: Showed 28 gastric polyps.  At least 1 hyperplastic polyp. - Last colonoscopy 11/23/2023: Normal.  No evidence of bleeding.   INTERVAL HISTORY: Betty Wright 68 y.o. female following for iron deficiency anemia.  Patient has no complaints today. She is doing well and feels better after iron infusion.   I have reviewed the past medical history, past surgical history, social history and family history with the patient    ALLERGIES:  is allergic to erythromycin, flonase [fluticasone], glucosamine-chondroitin, influenza a (h1n1) monovalent vaccine, naproxen, olmesartan, valsartan, and penicillins.  MEDICATIONS:  Current Outpatient Medications  Medication Sig Dispense Refill   amLODipine (NORVASC) 5 MG tablet Take 5 mg by mouth daily.     cyanocobalamin  1000 MCG tablet Take 1,000 mcg by mouth daily.     GARLIC PO Take by mouth.     glipiZIDE  (GLUCOTROL  XL) 5 MG 24 hr tablet Take 1 tablet (5 mg total) by mouth daily with breakfast. 90 tablet 3   lansoprazole (PREVACID) 30 MG capsule Take 30 mg by mouth daily at 12 noon. Pt unsure of dose     levocetirizine (XYZAL) 5 MG tablet Take 15 mg by mouth every evening.     metFORMIN  (GLUCOPHAGE -XR) 500 MG 24 hr tablet Take 2 tablets (1,000 mg total) by mouth daily with breakfast. 1000 mg bid 180 tablet 3   metoprolol succinate (TOPROL-XL) 25 MG 24 hr tablet Take 25 mg by mouth daily.     Misc Natural Products (BEET ROOT PO) Take by mouth.     Multiple Vitamin (MULTIVITAMIN) tablet Take 1 tablet by mouth daily.     Omega-3 Fatty Acids (FISH OIL PO) Take by mouth.     TART CHERRY PO Take by mouth daily at 6 (six) AM.     triamterene-hydrochlorothiazide (DYAZIDE) 37.5-25 MG capsule Take 1 capsule by mouth as needed.     No current facility-administered medications for this visit.     REVIEW OF SYSTEMS:   Constitutional: Denies fevers, chills or night sweats Eyes: Denies blurriness of vision Ears, nose,  mouth, throat, and face: Denies mucositis or sore throat Respiratory: Denies cough, dyspnea or wheezes Cardiovascular: Denies palpitation, chest discomfort or lower extremity swelling Gastrointestinal:  Denies nausea, heartburn or change in bowel habits Skin: Denies abnormal skin rashes Lymphatics: Denies new lymphadenopathy or easy bruising Neurological:Denies numbness, tingling or new weaknesses Behavioral/Psych: Mood is stable, no new changes  All other systems  were reviewed with the patient and are negative.  PHYSICAL EXAMINATION:   Vitals:   06/11/24 1006  BP: (!) 143/57  Pulse: 68  Resp: 18  Temp: 97.8 F (36.6 C)  SpO2: 100%     GENERAL:alert, no distress and comfortable SKIN: skin color, texture, turgor are normal, no rashes or significant lesions LUNGS: clear to auscultation and percussion with normal breathing effort HEART: regular rate & rhythm and no murmurs and no lower extremity edema ABDOMEN:abdomen soft, non-tender and normal bowel sounds Musculoskeletal:no cyanosis of digits and no clubbing  NEURO: alert & oriented x 3 with fluent speech  LABORATORY DATA:  I have reviewed the data as listed  Lab Results  Component Value Date   WBC 8.1 06/04/2024   NEUTROABS 5.0 06/04/2024   HGB 15.1 (H) 06/04/2024   HCT 45.2 06/04/2024   MCV 93.0 06/04/2024   PLT 259 06/04/2024       Chemistry      Component Value Date/Time   NA 138 06/04/2024 1045   K 4.7 06/04/2024 1045   CL 98 06/04/2024 1045   CO2 26 06/04/2024 1045   BUN 20 06/04/2024 1045   BUN 14 12/19/2023 0000   CREATININE 1.05 (H) 06/04/2024 1045   CREATININE 1.04 (H) 02/25/2021 0000   GLU 176 12/19/2023 0000      Component Value Date/Time   CALCIUM 9.8 06/04/2024 1045   ALKPHOS 66 06/04/2024 1045   AST 21 06/04/2024 1045   ALT 13 06/04/2024 1045   BILITOT 0.6 06/04/2024 1045      Latest Reference Range & Units 06/04/24 10:44 06/04/24 10:45  Iron 28 - 170 ug/dL 888   UIBC ug/dL 776   TIBC 749 - 549 ug/dL 665   Saturation Ratios 10.4 - 31.8 % 33 (H)   Ferritin 11 - 307 ng/mL 31   Folate >5.9 ng/mL  12.9  Vitamin B12 180 - 914 pg/mL 451   (H): Data is abnormally high  Colonoscopy: 12/24 (Care everywhere) Impression:            - The entire examined colon is normal on direct and retroflexion views.  - No specimens collected.   Endoscopy: 01/23/24: Impression:  - Normal esophagus.  - 28 gastric polyps. Resected and retrieved. via [facility]  -  Four gastric polyps. Resected and retrieved.  - Erythematous mucosa in the antrum. Biopsied.  - Normal duodenal bulb and second portion of the duodenum. Biopsied.   Pathology: FINAL MICROSCOPIC DIAGNOSIS:   A. SMALL BOWEL, BIOPSY:  - Small intestinal mucosa with no specific histopathologic changes  - Negative for increased intraepithelial lymphocytes or villous architectural changes   B. STOMACH, POLYPECTOMY:  - Fundic gland polyp(s)  - Negative for dysplasia   C. STOMACH, BIOPSY:  - Gastric antral and oxyntic mucosa with mild nonspecific reactive gastropathy  - Helicobacter pylori-like organisms are not identified on routine HE stain

## 2024-06-11 NOTE — Assessment & Plan Note (Signed)
 Likely secondary to previous gastric polyp bleeding. Ferritin and iron saturation levels improved significantly.  Patient continues to be not anemic and is symptomatically much better now.  - Discontinue oral iron - Continue eating diet rich in protein and greens  No further hematological needs at this time.  Patient can continue to follow-up with primary care and recommended to reach out to us  with future questions or concerns

## 2024-06-11 NOTE — Assessment & Plan Note (Signed)
 Multiple gastric polyps on recent and scoping.  No dysplasia.  -Continue to follow with Dr. Tasia Catchings for further care and frequent endoscopies.

## 2024-06-17 DIAGNOSIS — H3540 Unspecified peripheral retinal degeneration: Secondary | ICD-10-CM | POA: Diagnosis not present

## 2024-06-17 DIAGNOSIS — H524 Presbyopia: Secondary | ICD-10-CM | POA: Diagnosis not present

## 2024-06-20 ENCOUNTER — Encounter (INDEPENDENT_AMBULATORY_CARE_PROVIDER_SITE_OTHER): Payer: Self-pay | Admitting: *Deleted

## 2024-06-23 ENCOUNTER — Ambulatory Visit (INDEPENDENT_AMBULATORY_CARE_PROVIDER_SITE_OTHER): Admitting: Gastroenterology

## 2024-06-26 ENCOUNTER — Ambulatory Visit (INDEPENDENT_AMBULATORY_CARE_PROVIDER_SITE_OTHER): Admitting: Gastroenterology

## 2024-06-26 ENCOUNTER — Encounter (INDEPENDENT_AMBULATORY_CARE_PROVIDER_SITE_OTHER): Payer: Self-pay | Admitting: Gastroenterology

## 2024-06-26 VITALS — BP 116/69 | HR 69 | Temp 97.5°F | Ht 62.0 in | Wt 155.8 lb

## 2024-06-26 DIAGNOSIS — D509 Iron deficiency anemia, unspecified: Secondary | ICD-10-CM

## 2024-06-26 DIAGNOSIS — R195 Other fecal abnormalities: Secondary | ICD-10-CM | POA: Diagnosis not present

## 2024-06-26 DIAGNOSIS — D500 Iron deficiency anemia secondary to blood loss (chronic): Secondary | ICD-10-CM

## 2024-06-26 DIAGNOSIS — K317 Polyp of stomach and duodenum: Secondary | ICD-10-CM

## 2024-06-26 NOTE — Progress Notes (Signed)
 Referring Provider: Trudy Vaughn FALCON, MD Primary Care Physician:  Trudy Vaughn FALCON, MD Primary GI Physician: Dr. Cinderella   Chief Complaint  Patient presents with   Follow-up    Pt arrives for follow up. Pt had TCS/EGD in December 2024 at [facility] and had EGD here in Feb 2025. Per Dr. Cinderella will need to repeat EGD in 6 months. No issues/concerns at this time.    HPI:   Betty Wright is a 68 y.o. female with past medical history of  rheumatoid arthritis, fibromyalgia, diabetes on metformin   Patient presenting today for:  Follow up of IDA, multiple hyperplastic gastric polyps, looser stools  Last seen march, at that time had undergone EGD in feb with 28 gastric polyps, all fundic gland. Seeing hematology for IDA, had 2 iron infusions, feeling better since then, taking iron every other day  Recommended givens capsule if iron not improving in the next few months, repeat EGD 1 year   Labs on 7/2 with ferritin 31, iron 111, saturation 33, TIBC 334 b12 451, folate 12.9 hgb 15.1   Saw dr. Davonna with hematology on 7/9, discontinued oral iron, advised to continue iron rich diet   Present:  She stopped iron pills as directed by hematology. She is feeling good, denies shortness of breath, fatigue, blood in stools or black stools. She has some diarrhea but she thinks this is from metformin , she has looser stools most days but some days can be a bit looser but she states symptoms are not severe and do not interfere with her daily life. No abdominal pain. Occasionally may take some imodium but this is rare. She does well on prevacid, feels GERD Is well managed. No dysphagia or odynophagia.    Last EGD: 01/2024 - Normal esophagus. - 28 gastric polyps. Resected and retrieved. via [facility] - Four gastric polyps. Resected and retrieved. - Erythematous mucosa in the antrum. Biopsied. - Normal duodenal bulb and second portion of the duodenum. Biopsied. A. SMALL BOWEL, BIOPSY:  - Small intestinal mucosa  with no specific histopathologic changes  - Negative for increased intraepithelial lymphocytes or villous  architectural changes  B. STOMACH, POLYPECTOMY:  - Fundic gland polyp(s)  - Negative for dysplasia  C. STOMACH, BIOPSY:  - Gastric antral and oxyntic mucosa with mild nonspecific reactive  gastropathy  - Helicobacter pylori-like organisms are not identified on routine HE  stain    11/15/2023 follow-up EGD performed by Dr. Donny    Path: Hyperplastic polyps Negative for intestinal metaplasia dysplasia or malignancy Negative gastric urease test for H. pylori 11/15/2023 Last Colonoscopy:11/2023    Suggested repeat 10 years    Filed Weights   06/26/24 1344  Weight: 155 lb 12.8 oz (70.7 kg)     Past Medical History:  Diagnosis Date   Diabetes (HCC)    Fibromyalgia    Hypertension    Osteopenia    Rheumatoid arthritis (HCC)     Past Surgical History:  Procedure Laterality Date   BIOPSY  01/23/2024   Procedure: BIOPSY;  Surgeon: Cinderella Deatrice FALCON, MD;  [facility];  Service: Endoscopy;;   BREAST EXCISIONAL BIOPSY Left    benign   BREAST SURGERY Left    lumpectomy    CESAREAN SECTION     times two   COLONOSCOPY     ESOPHAGOGASTRODUODENOSCOPY     ESOPHAGOGASTRODUODENOSCOPY (EGD) WITH PROPOFOL  N/A 01/23/2024   Procedure: ESOPHAGOGASTRODUODENOSCOPY (EGD) WITH PROPOFOL ;  Surgeon: Cinderella Deatrice FALCON, MD;  [facility];  Service: Endoscopy;  Laterality: N/A;  7:30AM;ASA 1-2   PARTIAL HYSTERECTOMY     POLYPECTOMY  01/23/2024   Procedure: POLYPECTOMY INTESTINAL;  Surgeon: Cinderella Deatrice FALCON, MD;  [facility];  Service: Endoscopy;;   TUBAL LIGATION     WISDOM TOOTH EXTRACTION      Current Outpatient Medications  Medication Sig Dispense Refill   amLODipine (NORVASC) 5 MG tablet Take 5 mg by mouth daily.     glipiZIDE  (GLUCOTROL  XL) 5 MG 24 hr tablet Take 1 tablet (5 mg total) by mouth daily with breakfast. 90 tablet 3   lansoprazole  (PREVACID) 30 MG capsule Take 30 mg by mouth daily at 12 noon. Pt unsure of dose     levocetirizine (XYZAL) 5 MG tablet Take 15 mg by mouth every evening.     metFORMIN  (GLUCOPHAGE -XR) 500 MG 24 hr tablet Take 2 tablets (1,000 mg total) by mouth daily with breakfast. 1000 mg bid 180 tablet 3   metoprolol succinate (TOPROL-XL) 25 MG 24 hr tablet Take 25 mg by mouth daily.     Misc Natural Products (BEET ROOT PO) Take by mouth.     Omega-3 Fatty Acids (FISH OIL PO) Take by mouth.     triamterene-hydrochlorothiazide (DYAZIDE) 37.5-25 MG capsule Take 1 capsule by mouth as needed. (Patient taking differently: Take 1 capsule by mouth daily.)     GARLIC PO Take by mouth.     No current facility-administered medications for this visit.    Allergies as of 06/26/2024 - Review Complete 06/26/2024  Allergen Reaction Noted   Erythromycin  02/25/2021   Flonase [fluticasone]  02/25/2021   Glucosamine-chondroitin  06/11/2024   Influenza a (h1n1) monovalent vaccine  08/21/2019   Naproxen  06/11/2024   Olmesartan  08/31/2022   Valsartan  08/31/2022   Penicillins Rash 08/21/2019    Social History   Socioeconomic History   Marital status: Married    Spouse name: Not on file   Number of children: Not on file   Years of education: Not on file   Highest education level: Not on file  Occupational History   Not on file  Tobacco Use   Smoking status: Never    Passive exposure: Past   Smokeless tobacco: Never  Vaping Use   Vaping status: Never Used  Substance and Sexual Activity   Alcohol  use: Never   Drug use: Never   Sexual activity: Not on file  Other Topics Concern   Not on file  Social History Narrative   3 level home with husband; R handed ; high school grad; 1 cup coffee every am; exercise not much;    Social Drivers of Corporate investment banker Strain: Not on file  Food Insecurity: Not on file  Transportation Needs: Not on file  Physical Activity: Not on file  Stress: Not on  file  Social Connections: Unknown (04/18/2022)   Received from [employer]   Social Network    Social Network: Not on file    Review of systems General: negative for malaise, night sweats, fever, chills, weight loss Neck: Negative for lumps, goiter, pain and significant neck swelling Resp: Negative for cough, wheezing, dyspnea at rest CV: Negative for chest pain, leg swelling, palpitations, orthopnea GI: denies melena, hematochezia, nausea, vomiting, constipation, dysphagia, odyonophagia, early satiety or unintentional weight loss. +looser stools  MSK: Negative for joint pain or swelling, back pain, and muscle pain. Derm: Negative for itching or rash Psych: Denies depression, anxiety, memory loss, confusion. No  homicidal or suicidal ideation.  Heme: Negative for prolonged bleeding, bruising easily, and swollen nodes. Endocrine: Negative for cold or heat intolerance, polyuria, polydipsia and goiter. Neuro: negative for tremor, gait imbalance, syncope and seizures. The remainder of the review of systems is noncontributory.  Physical Exam: BP 116/69   Pulse 69   Temp (!) 97.5 F (36.4 C)   Ht 5' 2 (1.575 m)   Wt 155 lb 12.8 oz (70.7 kg)   BMI 28.50 kg/m  General:   Alert and oriented. No distress noted. Pleasant and cooperative.  Head:  Normocephalic and atraumatic. Eyes:  Conjuctiva clear without scleral icterus. Mouth:  Oral mucosa pink and moist. Good dentition. No lesions. Heart: Normal rate and rhythm, s1 and s2 heart sounds present.  Lungs: Clear lung sounds in all lobes. Respirations equal and unlabored. Abdomen:  +BS, soft, non-tender and non-distended. No rebound or guarding. No HSM or masses noted. Derm: No palmar erythema or jaundice Msk:  Symmetrical without gross deformities. Normal posture. Extremities:  Without edema. Neurologic:  Alert and  oriented x4 Psych:  Alert and cooperative. Normal mood and affect.  Invalid input(s): 6 MONTHS    ASSESSMENT: ESPERANZA MADRAZO is a 68 y.o. female presenting today for follow up of IDA, multiple gastric polyps and with looser stools  IDA: recent EGD/Colonoscopy as outlined above. Following with hematology and has had iron infusions in the past. Reassuringly, iron levels and Hgb improved, WNL on most recent labs earlier this month. She was advised by hematology to stop iron pills and continue with iron rich diet. She denies any rectal bleeding or melena. Feeling good overall. If she were to have persistent iron deficiency, would need to consider capsule endoscopy for further evaluation. For now will continue with iron rich diet.   Multiple gastric polyps: 28 gastric polyps on recent EGD in February, initially recommended to have repeat EGD in 6 months as she had a history of hyperplastic gastric polyps on previous EGD, though was recommended to have repeat EGD in 1 year at her follow up with Dr. Cinderella after most recent pathology revealed fundic gland polyps. I will confirm timing of repeat EGD with Dr. Cinderella, though likely will be 1 year given most recent polyps were fundic gland, vs. Hyperplastic.   Looser stools:  likely secondary to her metformin , relatively recent colonoscopy as outlined above. She takes imodium on occasion but notes looser stools do not really interfere with her daily life. No bleeding, melena or abdominal pain, recommended starting metamucil to help bulk up the stools.   PLAN:  -continue iron rich diet -continue prevacid 30mg  daily -confirm timing of repeat EGD with Dr. Cinderella  -can start metamucil to help bulk up stools   All questions were answered, patient verbalized understanding and is in agreement with plan as outlined above.   Follow Up: 6 months   Salisha Bardsley L. Anja Neuzil, MSN, APRN, AGNP-C Adult-Gerontology Nurse Practitioner [facility]

## 2024-06-26 NOTE — Patient Instructions (Signed)
 I will confirm timing of next EGD with Dr. Cinderella and let you know regarding this Continue with iron rich diet  Continue with prevacid 30mg  daily You can try metamucil to help bulk up the stool as you are having some diarrhea, make sure to drink plenty of water  with this  Follow up 6 months  It was a pleasure to see you today. I want to create trusting relationships with patients and provide genuine, compassionate, and quality care. I truly value your feedback! please be on the lookout for a survey regarding your visit with me today. I appreciate your input about our visit and your time in completing this!    Abdulwahab Demelo L. Xhaiden Coombs, MSN, APRN, AGNP-C Adult-Gerontology Nurse Practitioner [facility]

## 2024-07-02 ENCOUNTER — Encounter (INDEPENDENT_AMBULATORY_CARE_PROVIDER_SITE_OTHER): Payer: Self-pay

## 2024-07-21 DIAGNOSIS — K080 Exfoliation of teeth due to systemic causes: Secondary | ICD-10-CM | POA: Diagnosis not present

## 2024-08-15 NOTE — Progress Notes (Deleted)
 Office Visit Note  Patient: Betty Wright             Date of Birth: Sep 04, 1956           MRN: [id number]             PCP: Trudy Vaughn FALCON, MD Referring: Trudy Vaughn FALCON, MD Visit Date: 08/29/2024 Occupation: Data Unavailable  Subjective:  No chief complaint on file.   History of Present Illness: Betty Wright is a 68 y.o. female ***     Activities of Daily Living:  Patient reports morning stiffness for *** {minute/hour:19697}.   Patient {ACTIONS;DENIES/REPORTS:21021675::Denies} nocturnal pain.  Difficulty dressing/grooming: {ACTIONS;DENIES/REPORTS:21021675::Denies} Difficulty climbing stairs: {ACTIONS;DENIES/REPORTS:21021675::Denies} Difficulty getting out of chair: {ACTIONS;DENIES/REPORTS:21021675::Denies} Difficulty using hands for taps, buttons, cutlery, and/or writing: {ACTIONS;DENIES/REPORTS:21021675::Denies}  No Rheumatology ROS completed.   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/19/2021   Fibromyalgia 07/19/2021   Vitamin D  deficiency 07/19/2021   History of hyperlipidemia 07/19/2021   Essential hypertension 07/19/2021   History of gastroesophageal reflux (GERD) 07/19/2021   History of type 2 diabetes mellitus 07/19/2021   History of chronic kidney disease 07/19/2021   Osteopenia 07/19/2021    Past Medical History:  Diagnosis Date   Diabetes (HCC)    Fibromyalgia    Hypertension    Osteopenia    Rheumatoid arthritis (HCC)     Family History  Adopted: Yes  Problem Relation Age of Onset    Healthy Daughter    Healthy Son    Breast cancer Neg Hx    Past Surgical History:  Procedure Laterality Date   BIOPSY  01/23/2024   Procedure: BIOPSY;  Surgeon: Cinderella Deatrice FALCON, MD;  [facility];  Service: Endoscopy;;   BREAST EXCISIONAL BIOPSY Left    benign   BREAST SURGERY Left    lumpectomy    CESAREAN SECTION     times two   COLONOSCOPY     ESOPHAGOGASTRODUODENOSCOPY     ESOPHAGOGASTRODUODENOSCOPY (EGD) WITH PROPOFOL  N/A 01/23/2024   Procedure: ESOPHAGOGASTRODUODENOSCOPY (EGD) WITH PROPOFOL ;  Surgeon: Cinderella Deatrice FALCON, MD;  [facility];  Service: Endoscopy;  Laterality: N/A;  7:30AM;ASA 1-2   PARTIAL HYSTERECTOMY     POLYPECTOMY  01/23/2024   Procedure: POLYPECTOMY INTESTINAL;  Surgeon: Cinderella Deatrice FALCON, MD;  [facility];  Service: Endoscopy;;   TUBAL LIGATION     WISDOM TOOTH EXTRACTION     Social History   Tobacco Use   Smoking status: Never    Passive exposure: Past   Smokeless tobacco: Never  Vaping Use   Vaping status: Never Used  Substance Use Topics   Alcohol  use: Never   Drug use: Never   Social History   Social History Narrative   3 level home with husband; R handed ; high school grad; 1 cup coffee every am; exercise not much;      Immunization History  Administered Date(s) Administered   Moderna Sars-Covid-2 Vaccination 01/29/2020, 02/27/2020, 10/21/2020   Pneumococcal-Unspecified 08/29/2023     Objective: Vital Signs: There were no vitals taken  for this visit.   Physical Exam   Musculoskeletal Exam: ***  CDAI Exam: CDAI Score: -- Patient Global: --; Provider Global: -- Swollen: --; Tender: -- Joint Exam 08/29/2024   No joint exam has been documented for this visit   There is currently no information documented on the homunculus. Go to the Rheumatology activity and complete the homunculus joint exam.  Investigation: No additional findings.  Imaging: No results found.  Recent Labs: Lab Results   Component Value Date   WBC 8.1 06/04/2024   HGB 15.1 (H) 06/04/2024   PLT 259 06/04/2024   NA 138 06/04/2024   K 4.7 06/04/2024   CL 98 06/04/2024   CO2 26 06/04/2024   GLUCOSE 122 (H) 06/04/2024   BUN 20 06/04/2024   CREATININE 1.05 (H) 06/04/2024   BILITOT 0.6 06/04/2024   ALKPHOS 66 06/04/2024   AST 21 06/04/2024   ALT 13 06/04/2024   PROT 7.6 06/04/2024   ALBUMIN 4.0 06/04/2024   CALCIUM 9.8 06/04/2024   GFRAA 65 02/25/2021   QFTBGOLDPLUS NEGATIVE 02/28/2021    Speciality Comments: No specialty comments available.  Procedures:  No procedures performed Allergies: Erythromycin, Flonase [fluticasone], Glucosamine-chondroitin, Influenza a (h1n1) monovalent vaccine, Naproxen, Olmesartan, Valsartan, and Penicillins   Assessment / Plan:     Visit Diagnoses: No diagnosis found.  Orders: No orders of the defined types were placed in this encounter.  No orders of the defined types were placed in this encounter.   Face-to-face time spent with patient was *** minutes. Greater than 50% of time was spent in counseling and coordination of care.  Follow-Up Instructions: No follow-ups on file.   Daved JAYSON Gavel, CMA  Note - This record has been created using Animal nutritionist.  Chart creation errors have been sought, but may not always  have been located. Such creation errors do not reflect on  the standard of medical care.

## 2024-08-29 ENCOUNTER — Ambulatory Visit: Payer: Medicare Other | Admitting: Rheumatology

## 2024-08-29 DIAGNOSIS — E559 Vitamin D deficiency, unspecified: Secondary | ICD-10-CM

## 2024-08-29 DIAGNOSIS — M19041 Primary osteoarthritis, right hand: Secondary | ICD-10-CM

## 2024-08-29 DIAGNOSIS — Z8719 Personal history of other diseases of the digestive system: Secondary | ICD-10-CM

## 2024-08-29 DIAGNOSIS — R768 Other specified abnormal immunological findings in serum: Secondary | ICD-10-CM

## 2024-08-29 DIAGNOSIS — M170 Bilateral primary osteoarthritis of knee: Secondary | ICD-10-CM

## 2024-08-29 DIAGNOSIS — M8589 Other specified disorders of bone density and structure, multiple sites: Secondary | ICD-10-CM

## 2024-08-29 DIAGNOSIS — G8929 Other chronic pain: Secondary | ICD-10-CM

## 2024-08-29 DIAGNOSIS — M19071 Primary osteoarthritis, right ankle and foot: Secondary | ICD-10-CM

## 2024-08-29 DIAGNOSIS — Z87448 Personal history of other diseases of urinary system: Secondary | ICD-10-CM

## 2024-08-29 DIAGNOSIS — I10 Essential (primary) hypertension: Secondary | ICD-10-CM

## 2024-08-29 DIAGNOSIS — M797 Fibromyalgia: Secondary | ICD-10-CM

## 2024-08-29 DIAGNOSIS — Z8639 Personal history of other endocrine, nutritional and metabolic disease: Secondary | ICD-10-CM

## 2024-08-29 DIAGNOSIS — D509 Iron deficiency anemia, unspecified: Secondary | ICD-10-CM

## 2024-09-02 NOTE — Progress Notes (Signed)
 Betty Wright                                          MRN: [id number]   09/02/2024   The [facility] Quality Team Specialist reviewed this patient medical record for the purposes of chart review for care gap closure. The following were reviewed: chart review for care gap closure-kidney health evaluation for diabetes:uACR.    [facility] Quality Team

## 2024-09-09 ENCOUNTER — Telehealth: Payer: Self-pay | Admitting: *Deleted

## 2024-09-09 NOTE — Telephone Encounter (Signed)
 Yes, I told her three times that this was the lab you were needing.

## 2024-09-09 NOTE — Telephone Encounter (Signed)
 Patient called and states that she rec'd a message that she was to have a lab test prior to her office visit on Monday, October 13.. Patient was called and she shared that she was having lab work drawn for her PCP as she has appointment Monday morning with PCP. She feels that they are going to be doing this test, and she will bring them to her afternoon visit with Whitney.  Patient was advised that [facility] would need those results prior to her being seen.

## 2024-09-09 NOTE — Telephone Encounter (Signed)
 Thanks, as long as she brings a copy of the lab results from her PCP, that will work.  What I had ordered was a CMP to recheck kidney function.

## 2024-09-11 ENCOUNTER — Ambulatory Visit: Admitting: Nurse Practitioner

## 2024-09-11 DIAGNOSIS — Z1329 Encounter for screening for other suspected endocrine disorder: Secondary | ICD-10-CM | POA: Diagnosis not present

## 2024-09-11 DIAGNOSIS — D649 Anemia, unspecified: Secondary | ICD-10-CM | POA: Diagnosis not present

## 2024-09-11 DIAGNOSIS — E1165 Type 2 diabetes mellitus with hyperglycemia: Secondary | ICD-10-CM | POA: Diagnosis not present

## 2024-09-11 DIAGNOSIS — E559 Vitamin D deficiency, unspecified: Secondary | ICD-10-CM | POA: Diagnosis not present

## 2024-09-11 DIAGNOSIS — Z13220 Encounter for screening for lipoid disorders: Secondary | ICD-10-CM | POA: Diagnosis not present

## 2024-09-11 LAB — LAB REPORT - SCANNED
A1c: 7.3
EGFR: 57.5
TSH: 5.004

## 2024-09-12 LAB — MICROALBUMIN / CREATININE URINE RATIO
Albumin, Urine POC: 14.4
Albumin/Creatinine Ratio, Urine, POC: 10
Creatinine, POC: 138.5 mg/dL

## 2024-09-15 ENCOUNTER — Ambulatory Visit: Admitting: Nurse Practitioner

## 2024-09-15 ENCOUNTER — Encounter: Payer: Self-pay | Admitting: Nurse Practitioner

## 2024-09-15 VITALS — BP 116/76 | HR 72 | Ht 62.0 in | Wt 153.0 lb

## 2024-09-15 DIAGNOSIS — Z1331 Encounter for screening for depression: Secondary | ICD-10-CM | POA: Diagnosis not present

## 2024-09-15 DIAGNOSIS — Z1389 Encounter for screening for other disorder: Secondary | ICD-10-CM | POA: Diagnosis not present

## 2024-09-15 DIAGNOSIS — E782 Mixed hyperlipidemia: Secondary | ICD-10-CM | POA: Diagnosis not present

## 2024-09-15 DIAGNOSIS — E1165 Type 2 diabetes mellitus with hyperglycemia: Secondary | ICD-10-CM | POA: Diagnosis not present

## 2024-09-15 DIAGNOSIS — M069 Rheumatoid arthritis, unspecified: Secondary | ICD-10-CM | POA: Diagnosis not present

## 2024-09-15 DIAGNOSIS — Z7984 Long term (current) use of oral hypoglycemic drugs: Secondary | ICD-10-CM

## 2024-09-15 DIAGNOSIS — E559 Vitamin D deficiency, unspecified: Secondary | ICD-10-CM

## 2024-09-15 DIAGNOSIS — Z6827 Body mass index (BMI) 27.0-27.9, adult: Secondary | ICD-10-CM | POA: Diagnosis not present

## 2024-09-15 DIAGNOSIS — N1831 Chronic kidney disease, stage 3a: Secondary | ICD-10-CM | POA: Diagnosis not present

## 2024-09-15 DIAGNOSIS — K219 Gastro-esophageal reflux disease without esophagitis: Secondary | ICD-10-CM | POA: Diagnosis not present

## 2024-09-15 DIAGNOSIS — I10 Essential (primary) hypertension: Secondary | ICD-10-CM | POA: Diagnosis not present

## 2024-09-15 DIAGNOSIS — E1122 Type 2 diabetes mellitus with diabetic chronic kidney disease: Secondary | ICD-10-CM | POA: Diagnosis not present

## 2024-09-15 DIAGNOSIS — Z0001 Encounter for general adult medical examination with abnormal findings: Secondary | ICD-10-CM | POA: Diagnosis not present

## 2024-09-15 MED ORDER — METFORMIN HCL ER 500 MG PO TB24: 1000.0000 mg | ORAL_TABLET | Freq: Every day | ORAL | 3 refills | Status: AC

## 2024-09-15 MED ORDER — GLIPIZIDE ER 5 MG PO TB24: 5.0000 mg | ORAL_TABLET | Freq: Every day | ORAL | 3 refills | Status: AC

## 2024-09-15 NOTE — Progress Notes (Signed)
 Endocrinology Follow Up Note       09/15/2024, 3:00 PM   Subjective:    Patient ID: Betty Wright, female    DOB: 1956/02/26.  Betty Wright is being seen in follow up after being seen in consultation for management of currently uncontrolled symptomatic diabetes requested by  Trudy Vaughn FALCON, MD.   Past Medical History:  Diagnosis Date   Diabetes [facility])    Fibromyalgia    Hypertension    Osteopenia    Rheumatoid arthritis [facility])     Past Surgical History:  Procedure Laterality Date   BIOPSY  01/23/2024   Procedure: BIOPSY;  Surgeon: Cinderella Deatrice FALCON, MD;  [facility]: [facility];  Service: Endoscopy;;   BREAST EXCISIONAL BIOPSY Left    benign   BREAST SURGERY Left    lumpectomy    CESAREAN SECTION     times two   COLONOSCOPY     ESOPHAGOGASTRODUODENOSCOPY     ESOPHAGOGASTRODUODENOSCOPY (EGD) WITH PROPOFOL  N/A 01/23/2024   Procedure: ESOPHAGOGASTRODUODENOSCOPY (EGD) WITH PROPOFOL ;  Surgeon: Cinderella Deatrice FALCON, MD;  Location: [facility];  Service: Endoscopy;  Laterality: N/A;  7:30AM;ASA 1-2   PARTIAL HYSTERECTOMY     POLYPECTOMY  01/23/2024   Procedure: POLYPECTOMY INTESTINAL;  Surgeon: Cinderella Deatrice FALCON, MD;  Location: [facility];  Service: Endoscopy;;   TUBAL LIGATION     WISDOM TOOTH EXTRACTION      Social History   Socioeconomic History   Marital status: Married    Spouse name: Not on file   Number of children: Not on file   Years of education: Not on file   Highest education level: Not on file  Occupational History   Not on file  Tobacco Use   Smoking status: Never    Passive exposure: Past   Smokeless tobacco: Never  Vaping Use   Vaping status: Never Used  Substance and Sexual Activity   Alcohol  use: Never   Drug use: Never   Sexual activity: Not on file  Other Topics Concern   Not on file  Social History Narrative   3 level home with husband; R handed ; high  school grad; 1 cup coffee every am; exercise not much;    Social Drivers of Corporate investment banker Strain: Not on file  Food Insecurity: Not on file  Transportation Needs: Not on file  Physical Activity: Not on file  Stress: Not on file  Social Connections: Unknown (04/18/2022)   Received from [facility]   Social Network    Social Network: Not on file    Family History  Adopted: Yes  Problem Relation Age of Onset   Healthy Daughter    Healthy Son    Breast cancer Neg Hx     Outpatient Encounter Medications as of 09/15/2024  Medication Sig   amLODipine (NORVASC) 5 MG tablet Take 5 mg by mouth daily.   lansoprazole (PREVACID) 30 MG capsule Take 30 mg by mouth daily at 12 noon. Pt unsure of dose   levocetirizine (XYZAL) 5 MG tablet Take 15 mg by mouth every evening.   metoprolol succinate (TOPROL-XL) 25 MG 24 hr tablet Take 25 mg by mouth daily.  Misc Natural Products (BEET ROOT PO) Take by mouth.   Omega-3 Fatty Acids (FISH OIL PO) Take by mouth.   triamterene-hydrochlorothiazide (DYAZIDE) 37.5-25 MG capsule Take 1 capsule by mouth as needed. (Patient taking differently: Take 1 capsule by mouth daily. Patient states that she takes 1/2 tablet a day.)   [DISCONTINUED] glipiZIDE  (GLUCOTROL  XL) 5 MG 24 hr tablet Take 1 tablet (5 mg total) by mouth daily with breakfast.   [DISCONTINUED] metFORMIN  (GLUCOPHAGE -XR) 500 MG 24 hr tablet Take 2 tablets (1,000 mg total) by mouth daily with breakfast. 1000 mg bid   glipiZIDE  (GLUCOTROL  XL) 5 MG 24 hr tablet Take 1 tablet (5 mg total) by mouth daily with breakfast.   metFORMIN  (GLUCOPHAGE -XR) 500 MG 24 hr tablet Take 2 tablets (1,000 mg total) by mouth daily with breakfast. 1000 mg bid   [DISCONTINUED] GARLIC PO Take by mouth. (Patient not taking: Reported on 09/15/2024)   No facility-administered encounter medications on file as of 09/15/2024.    ALLERGIES: Allergies  Allergen Reactions   Erythromycin    Flonase [Fluticasone]     Glucosamine-Chondroitin     Other Reaction(s): Unknown   Influenza A (H1n1) Monovalent Vaccine     She wasn't sure which one but cant take the flu shot - arm became swollen.  Other Reaction(s): Unknown   Naproxen     Other Reaction(s): Unknown   Olmesartan     Muscle spasms   Valsartan     Muscle spasms   Penicillins Rash    VACCINATION STATUS: Immunization History  Administered Date(s) Administered   Moderna Sars-Covid-2 Vaccination 01/29/2020, 02/27/2020, 10/21/2020   Pneumococcal-Unspecified 08/29/2023    Diabetes She presents for her follow-up diabetic visit. She has type 2 diabetes mellitus. Onset time: Diagnosed at approx age of 68. Her disease course has been fluctuating. There are no hypoglycemic associated symptoms. There are no diabetic associated symptoms. There are no hypoglycemic complications. Diabetic complications include nephropathy and retinopathy. Risk factors for coronary artery disease include diabetes mellitus, dyslipidemia, family history, hypertension and post-menopausal. Current diabetic treatment includes oral agent (dual therapy). She is compliant with treatment most of the time. Her weight is fluctuating minimally. She is following a generally unhealthy diet. When asked about meal planning, she reported none. She has not had a previous visit with a dietitian. She participates in exercise intermittently. Her home blood glucose trend is decreasing steadily. Her overall blood glucose range is 140-180 mg/dl. (She presents today with her logs showing fluctuating glycemic profile with mostly at target readings.  Her most recent A1c, checked on 10/9 by her PCP was 7.3%, increasing slightly from last visit of 6.6%.  She does experience less hypoglycemia since reducing the Glipizide  last visit.  She is bummed about her A1c as she has worked really hard.  She has plans to incorporate more physical exercise to her regimen.) She does not see a podiatrist.Eye exam is current.      Review of systems  Constitutional: + slowly decreasing body weight,  current Body mass index is 27.98 kg/m. , no fatigue, no subjective hyperthermia, no subjective hypothermia Eyes: no blurry vision, no xerophthalmia ENT: no sore throat, no nodules palpated in throat, no dysphagia/odynophagia, no hoarseness Cardiovascular: no chest pain, no shortness of breath, no palpitations, no leg swelling Respiratory: no cough, no shortness of breath Gastrointestinal: no nausea/vomiting/diarrhea Musculoskeletal: no muscle/joint aches Skin: no rashes, no hyperemia Neurological: no tremors, no numbness, no tingling, no dizziness Psychiatric: no depression, no anxiety  Objective:  BP 116/76 (BP Location: Left Arm, Patient Position: Sitting)   Pulse 72   Ht 5' 2 (1.575 m)   Wt 153 lb (69.4 kg)   BMI 27.98 kg/m   Wt Readings from Last 3 Encounters:  09/15/24 153 lb (69.4 kg)  06/26/24 155 lb 12.8 oz (70.7 kg)  06/11/24 155 lb (70.3 kg)     BP Readings from Last 3 Encounters:  09/15/24 116/76  06/26/24 116/69  06/11/24 (!) 143/57     Physical Exam- Limited  Constitutional:  Body mass index is 27.98 kg/m. , not in acute distress, normal state of mind Eyes:  EOMI, no exophthalmos Musculoskeletal: no gross deformities, strength intact in all four extremities, no gross restriction of joint movements Skin:  no rashes, no hyperemia Neurological: no tremor with outstretched hands   Diabetic Foot Exam - Simple   No data filed      CMP ( most recent) CMP     Component Value Date/Time   NA 138 06/04/2024 1045   K 4.7 06/04/2024 1045   CL 98 06/04/2024 1045   CO2 26 06/04/2024 1045   GLUCOSE 122 (H) 06/04/2024 1045   BUN 20 06/04/2024 1045   BUN 14 12/19/2023 0000   CREATININE 1.05 (H) 06/04/2024 1045   CREATININE 1.04 (H) 02/25/2021 0000   CALCIUM 9.8 06/04/2024 1045   PROT 7.6 06/04/2024 1045   ALBUMIN 4.0 06/04/2024 1045   AST 21 06/04/2024 1045   ALT 13  06/04/2024 1045   ALKPHOS 66 06/04/2024 1045   BILITOT 0.6 06/04/2024 1045   EGFR 43.5 03/18/2024 1501   EGFR 56.5 12/19/2023 0000   GFRNONAA 58 (L) 06/04/2024 1045   GFRNONAA 56 (L) 02/25/2021 0000     Diabetic Labs (most recent): Lab Results  Component Value Date   HGBA1C 6.6 (A) 05/08/2024   HGBA1C 7.7 12/19/2023     Lipid Panel ( most recent) Lipid Panel     Component Value Date/Time   TRIG 170 (A) 12/19/2023 0000   LDLCALC 131 12/19/2023 0000      Lab Results  Component Value Date   TSH 3.19 10/23/2023   TSH 1.92 08/21/2019           Assessment & Plan:   1) Type 2 diabetes mellitus with stage 3a chronic kidney disease, without long-term current use of insulin (HCC) (Primary)  She presents today with her logs showing fluctuating glycemic profile with mostly at target readings.  Her most recent A1c, checked on 10/9 by her PCP was 7.3%, increasing slightly from last visit of 6.6%.  She does experience less hypoglycemia since reducing the Glipizide  last visit.  She is bummed about her A1c as she has worked really hard.  She has plans to incorporate more physical exercise to her regimen.  She did have a cold between visits as well.  - Betty Wright has currently uncontrolled symptomatic type 2 DM since 68 years of age.   -Recent labs reviewed.  - I had a long discussion with her about the progressive nature of diabetes and the pathology behind its complications. -her diabetes is complicated by retinopathy and CKD stage 3a and she remains at a high risk for more acute and chronic complications which include CAD, CVA, CKD, retinopathy, and neuropathy. These are all discussed in detail with her.  The following Lifestyle Medicine recommendations according to American College of Lifestyle Medicine [facility]) were discussed and offered to patient and she agrees to start the journey:  A. Whole Foods,  Plant-based plate comprising of fruits and vegetables, plant-based  proteins, whole-grain carbohydrates was discussed in detail with the patient.   A list for source of those nutrients were also provided to the patient.  Patient will use only water  or unsweetened tea for hydration. B.  The need to stay away from risky substances including alcohol , smoking; obtaining 7 to 9 hours of restorative sleep, at least 150 minutes of moderate intensity exercise weekly, the importance of healthy social connections,  and stress reduction techniques were discussed. C.  A full color page of  Calorie density of various food groups per pound showing examples of each food groups was provided to the patient.  - Nutritional counseling will be repeated/built upon at each appointment.  - The patient admits there is a room for improvement in their diet and drink choices. -  Suggestion is made for the patient to avoid simple carbohydrates from their diet including Cakes, Sweet Desserts / Pastries, Ice Cream, Soda (diet and regular), Sweet Tea, Candies, Chips, Cookies, Sweet Pastries, Store Bought Juices, Alcohol  in Excess of 1-2 drinks a day, Artificial Sweeteners, Coffee Creamer, and Sugar-free Products. This will help patient to have stable blood glucose profile and potentially avoid unintended weight gain.   - I encouraged the patient to switch to unprocessed or minimally processed complex starch and increased protein intake (animal or plant source), fruits, and vegetables.   - Patient is advised to stick to a routine mealtimes to eat 3 meals a day and avoid unnecessary snacks (to snack only to correct hypoglycemia).  - I have approached her with the following individualized plan to manage her diabetes and patient agrees:   -She is advised to continue Metformin  1000 mg ER daily and Glipizide  5 mg XL daily.  Will give more time for diet and lifestyle changes before considering changing her regimen.  -she is encouraged to start/continue monitoring glucose 2 times daily, before  breakfast and before bed, and to call the clinic if she has readings less than 70 or above 300 for 3 tests in a row.  - she is warned not to take insulin without proper monitoring per orders. - Adjustment parameters are given to her for hypo and hyperglycemia in writing.  -She is not interested in trying GLP1 products.  - Specific targets for  A1c; LDL, HDL, and Triglycerides were discussed with the patient.  2) Blood Pressure /Hypertension:  her blood pressure is controlled to target.   she is advised to continue her current medications as prescribed by her PCP.  3) Lipids/Hyperlipidemia:    Review of her recent lipid panel from 09/11/24 showed uncontrolled LDL at 158 and elevated triglycerides of 178 (worsening).  she is not currently on any lipid lowering medications other than Omega 3 Fatty Acids.  4)  Weight/Diet:  her Body mass index is 27.98 kg/m.  -  she is NOT a candidate for major weight loss. I discussed with her the fact that loss of 5 - 10% of her  current body weight will have the most impact on her diabetes management.  Exercise, and detailed carbohydrates information provided  -  detailed on discharge instructions.  5) Chronic Care/Health Maintenance: -she is not on ACEI/ARB or Statin medications and is encouraged to initiate and continue to follow up with Ophthalmology, Dentist, Podiatrist at least yearly or according to recommendations, and advised to stay away from smoking. I have recommended yearly flu vaccine and pneumonia vaccine at least every 5 years; moderate intensity exercise for  up to 150 minutes weekly; and sleep for at least 7 hours a day.  - she is advised to maintain close follow up with Trudy Vaughn FALCON, MD for primary care needs, as well as her other providers for optimal and coordinated care.  Her recent labs show abnormal TSH, scheduled to follow up with her PCP regarding this in the next few months.    I spent  46  minutes in the care of the patient  today including review of labs from CMP, Lipids, Thyroid  Function, Hematology (current and previous including abstractions from other facilities); face-to-face time discussing  her blood glucose readings/logs, discussing hypoglycemia and hyperglycemia episodes and symptoms, medications doses, her options of short and long term treatment based on the latest standards of care / guidelines;  discussion about incorporating lifestyle medicine;  and documenting the encounter. Risk reduction counseling performed per USPSTF guidelines to reduce obesity and cardiovascular risk factors.     Please refer to Patient Instructions for Blood Glucose Monitoring and Insulin/Medications Dosing Guide  in media tab for additional information. Please  also refer to  Patient Self Inventory in the Media  tab for reviewed elements of pertinent patient history.  Betty Wright participated in the discussions, expressed understanding, and voiced agreement with the above plans.  All questions were answered to her satisfaction. she is encouraged to contact clinic should she have any questions or concerns prior to her return visit.     Follow up plan: - Return in about 3 months (around 12/16/2024) for Diabetes F/U with A1c in office, No previsit labs, Bring meter and logs.    Benton Rio, [facility] [address] [place], [place] [address] Phone: [phone] Fax: [phone]  09/15/2024, 3:00 PM

## 2024-12-10 LAB — HM COLONOSCOPY

## 2024-12-11 ENCOUNTER — Encounter (INDEPENDENT_AMBULATORY_CARE_PROVIDER_SITE_OTHER): Payer: Self-pay | Admitting: *Deleted

## 2024-12-17 ENCOUNTER — Ambulatory Visit: Admitting: Nurse Practitioner

## 2024-12-17 ENCOUNTER — Encounter: Payer: Self-pay | Admitting: Nurse Practitioner

## 2024-12-17 VITALS — BP 128/66 | HR 74 | Ht 62.0 in | Wt 153.4 lb

## 2024-12-17 DIAGNOSIS — E559 Vitamin D deficiency, unspecified: Secondary | ICD-10-CM | POA: Diagnosis not present

## 2024-12-17 DIAGNOSIS — N1831 Type 2 diabetes mellitus with diabetic chronic kidney disease: Secondary | ICD-10-CM

## 2024-12-17 DIAGNOSIS — E782 Mixed hyperlipidemia: Secondary | ICD-10-CM

## 2024-12-17 DIAGNOSIS — I10 Essential (primary) hypertension: Secondary | ICD-10-CM

## 2024-12-17 DIAGNOSIS — R7989 Other specified abnormal findings of blood chemistry: Secondary | ICD-10-CM | POA: Diagnosis not present

## 2024-12-17 DIAGNOSIS — E1122 Type 2 diabetes mellitus with diabetic chronic kidney disease: Secondary | ICD-10-CM

## 2024-12-17 DIAGNOSIS — Z7984 Long term (current) use of oral hypoglycemic drugs: Secondary | ICD-10-CM

## 2024-12-17 LAB — POCT GLYCOSYLATED HEMOGLOBIN (HGB A1C): Hemoglobin A1C: 7.3 % — AB (ref 4.0–5.6)

## 2024-12-17 NOTE — Progress Notes (Signed)
 "                                                                        Endocrinology Follow Up Note       12/17/2024, 2:42 PM   Subjective:    Patient ID: Betty Wright, female    DOB: 1956/10/14.  Betty Wright is being seen in follow up after being seen in consultation for management of currently uncontrolled symptomatic diabetes requested by  Trudy Vaughn FALCON, MD.   Past Medical History:  Diagnosis Date   Diabetes [facility])    Fibromyalgia    Hypertension    Osteopenia    Rheumatoid arthritis [facility])     Past Surgical History:  Procedure Laterality Date   BIOPSY  01/23/2024   Procedure: BIOPSY;  Surgeon: Cinderella Deatrice FALCON, MD;  Location: [facility];  Service: Endoscopy;;   BREAST EXCISIONAL BIOPSY Left    benign   BREAST SURGERY Left    lumpectomy    CESAREAN SECTION     times two   COLONOSCOPY     ESOPHAGOGASTRODUODENOSCOPY     ESOPHAGOGASTRODUODENOSCOPY (EGD) WITH PROPOFOL  N/A 01/23/2024   Procedure: ESOPHAGOGASTRODUODENOSCOPY (EGD) WITH PROPOFOL ;  Surgeon: Cinderella Deatrice FALCON, MD;  Location: [facility];  Service: Endoscopy;  Laterality: N/A;  7:30AM;ASA 1-2   PARTIAL HYSTERECTOMY     POLYPECTOMY  01/23/2024   Procedure: POLYPECTOMY INTESTINAL;  Surgeon: Cinderella Deatrice FALCON, MD;  Location: [facility];  Service: Endoscopy;;   TUBAL LIGATION     WISDOM TOOTH EXTRACTION      Social History   Socioeconomic History   Marital status: Married    Spouse name: Not on file   Number of children: Not on file   Years of education: Not on file   Highest education level: Not on file  Occupational History   Not on file  Tobacco Use   Smoking status: Never    Passive exposure: Past   Smokeless tobacco: Never  Vaping Use   Vaping status: Never Used  Substance and Sexual Activity   Alcohol  use: Never   Drug use: Never   Sexual activity: Not on file  Other Topics Concern   Not on file  Social History Narrative   3 level home with husband; R handed ; high school  grad; 1 cup coffee every am; exercise not much;    Social Drivers of Health   Tobacco Use: Low Risk (12/17/2024)   Patient History    Smoking Tobacco Use: Never    Smokeless Tobacco Use: Never    Passive Exposure: Past  Financial Resource Strain: Not on file  Food Insecurity: Not on file  Transportation Needs: Not on file  Physical Activity: Not on file  Stress: Not on file  Social Connections: Unknown (04/18/2022)   Received [facility]   Social Network    Social Network: Not on file  Depression (PHQ2-9): Low Risk (06/11/2024)   Depression (PHQ2-9)    PHQ-2 Score: 0  Alcohol  Screen: Not on file  Housing: Not on file  Utilities: Not on file  Health Literacy: Not on file    Family History  Adopted: Yes  Problem Relation Age of Onset   Healthy Daughter  Healthy Son    Breast cancer Neg Hx     Outpatient Encounter Medications as of 12/17/2024  Medication Sig   amLODipine (NORVASC) 5 MG tablet Take 5 mg by mouth daily.   glipiZIDE  (GLUCOTROL  XL) 5 MG 24 hr tablet Take 1 tablet (5 mg total) by mouth daily with breakfast.   lansoprazole (PREVACID) 30 MG capsule Take 30 mg by mouth daily at 12 noon. Pt unsure of dose   levocetirizine (XYZAL) 5 MG tablet Take 15 mg by mouth every evening.   metFORMIN  (GLUCOPHAGE -XR) 500 MG 24 hr tablet Take 2 tablets (1,000 mg total) by mouth daily with breakfast. 1000 mg bid   metoprolol succinate (TOPROL-XL) 25 MG 24 hr tablet Take 25 mg by mouth daily.   Misc Natural Products (BEET ROOT PO) Take by mouth.   Omega-3 Fatty Acids (FISH OIL PO) Take by mouth.   triamterene-hydrochlorothiazide (DYAZIDE) 37.5-25 MG capsule Take 1 capsule by mouth as needed. (Patient taking differently: Take 1 capsule by mouth daily. Patient states that she takes 1/2 tablet a day.)   No facility-administered encounter medications on file as of 12/17/2024.    ALLERGIES: Allergies  Allergen Reactions   Erythromycin    Flonase [Fluticasone]     Glucosamine-Chondroitin     Other Reaction(s): Unknown   Influenza A (H1n1) Monovalent Vaccine     She wasn't sure which one but cant take the flu shot - arm became swollen.  Other Reaction(s): Unknown   Naproxen     Other Reaction(s): Unknown   Olmesartan     Muscle spasms   Valsartan     Muscle spasms   Penicillins Rash    VACCINATION STATUS: Immunization History  Administered Date(s) Administered   Moderna Sars-Covid-2 Vaccination 01/29/2020, 02/27/2020, 10/21/2020   Pneumococcal-Unspecified 08/29/2023    Diabetes She presents for her follow-up diabetic visit. She has type 2 diabetes mellitus. Onset time: Diagnosed at approx age of 86. Her disease course has been stable. There are no hypoglycemic associated symptoms. There are no diabetic associated symptoms. There are no hypoglycemic complications. Diabetic complications include nephropathy and retinopathy. Risk factors for coronary artery disease include diabetes mellitus, dyslipidemia, family history, hypertension and post-menopausal. Current diabetic treatment includes oral agent (dual therapy). She is compliant with treatment most of the time. Her weight is fluctuating minimally. She is following a generally unhealthy diet. When asked about meal planning, she reported none. She has not had a previous visit with a dietitian. She participates in exercise intermittently. Her home blood glucose trend is fluctuating minimally. Her overall blood glucose range is 140-180 mg/dl. (She presents today with her logs showing fluctuating glycemic profile with mostly at target readings.  Her POCT A1c today is 7.3%, unchanged from previous visit.  She did have occasional hypoglycemia but notes she skipped meals or was more active than usual on those days.  ) She does not see a podiatrist.Eye exam is current.    Review of systems  Constitutional: + Minimally fluctuating body weight,  current Body mass index is 28.06 kg/m. , no fatigue, no  subjective hyperthermia, no subjective hypothermia Eyes: no blurry vision, no xerophthalmia ENT: no sore throat, no nodules palpated in throat, no dysphagia/odynophagia, no hoarseness Cardiovascular: no chest pain, no shortness of breath, no palpitations, no leg swelling Respiratory: no cough, no shortness of breath Gastrointestinal: no nausea/vomiting/diarrhea Musculoskeletal: no muscle/joint aches Skin: no rashes, no hyperemia Neurological: no tremors, no numbness, no tingling, no dizziness Psychiatric: no depression, no anxiety  Objective:  BP 128/66 (BP Location: Left Arm, Patient Position: Sitting, Cuff Size: Large)   Pulse 74   Ht 5' 2 (1.575 m)   Wt 153 lb 6.4 oz (69.6 kg)   BMI 28.06 kg/m   Wt Readings from Last 3 Encounters:  12/17/24 153 lb 6.4 oz (69.6 kg)  09/15/24 153 lb (69.4 kg)  06/26/24 155 lb 12.8 oz (70.7 kg)     BP Readings from Last 3 Encounters:  12/17/24 128/66  09/15/24 116/76  06/26/24 116/69      Physical Exam- Limited  Constitutional:  Body mass index is 28.06 kg/m. , not in acute distress, normal state of mind Eyes:  EOMI, no exophthalmos Musculoskeletal: no gross deformities, strength intact in all four extremities, no gross restriction of joint movements Skin:  no rashes, no hyperemia Neurological: no tremor with outstretched hands   Diabetic Foot Exam - Simple   No data filed      CMP ( most recent) CMP     Component Value Date/Time   NA 138 06/04/2024 1045   K 4.7 06/04/2024 1045   CL 98 06/04/2024 1045   CO2 26 06/04/2024 1045   GLUCOSE 122 (H) 06/04/2024 1045   BUN 20 06/04/2024 1045   BUN 14 12/19/2023 0000   CREATININE 1.05 (H) 06/04/2024 1045   CREATININE 1.04 (H) 02/25/2021 0000   CALCIUM 9.8 06/04/2024 1045   PROT 7.6 06/04/2024 1045   ALBUMIN 4.0 06/04/2024 1045   AST 21 06/04/2024 1045   ALT 13 06/04/2024 1045   ALKPHOS 66 06/04/2024 1045   BILITOT 0.6 06/04/2024 1045   EGFR 57.5 09/11/2024 0749    EGFR 56.5 12/19/2023 0000   GFRNONAA 58 (L) 06/04/2024 1045   GFRNONAA 56 (L) 02/25/2021 0000     Diabetic Labs (most recent): Lab Results  Component Value Date   HGBA1C 7.3 (A) 12/17/2024   HGBA1C 6.6 (A) 05/08/2024   HGBA1C 7.7 12/19/2023     Lipid Panel ( most recent) Lipid Panel     Component Value Date/Time   TRIG 170 (A) 12/19/2023 0000   LDLCALC 131 12/19/2023 0000      Lab Results  Component Value Date   TSH 5.004 09/11/2024   TSH 3.19 10/23/2023   TSH 1.92 08/21/2019           Assessment & Plan:   1) Type 2 diabetes mellitus with stage 3a chronic kidney disease, without long-term current use of insulin (HCC) (Primary)  She presents today with her logs showing fluctuating glycemic profile with mostly at target readings.  Her POCT A1c today is 7.3%, unchanged from previous visit.  She did have occasional hypoglycemia but notes she skipped meals or was more active than usual on those days.   - DAZIA LIPPOLD has currently uncontrolled symptomatic type 2 DM since 69 years of age.   -Recent labs reviewed.  - I had a long discussion with her about the progressive nature of diabetes and the pathology behind its complications. -her diabetes is complicated by retinopathy and CKD stage 3a and she remains at a high risk for more acute and chronic complications which include CAD, CVA, CKD, retinopathy, and neuropathy. These are all discussed in detail with her.  The following Lifestyle Medicine recommendations according to American College of Lifestyle Medicine [facility]) were discussed and offered to patient and she agrees to start the journey:  A. Whole Foods, Plant-based plate comprising of fruits and vegetables, plant-based proteins, whole-grain carbohydrates was discussed in detail with the  patient.   A list for source of those nutrients were also provided to the patient.  Patient will use only water  or unsweetened tea for hydration. B.  The need to stay away from  risky substances including alcohol , smoking; obtaining 7 to 9 hours of restorative sleep, at least 150 minutes of moderate intensity exercise weekly, the importance of healthy social connections,  and stress reduction techniques were discussed. C.  A full color page of  Calorie density of various food groups per pound showing examples of each food groups was provided to the patient.  - Nutritional counseling repeated/built upon at each appointment.  - The patient admits there is a room for improvement in their diet and drink choices. -  Suggestion is made for the patient to avoid simple carbohydrates from their diet including Cakes, Sweet Desserts / Pastries, Ice Cream, Soda (diet and regular), Sweet Tea, Candies, Chips, Cookies, Sweet Pastries, Store Bought Juices, Alcohol  in Excess of 1-2 drinks a day, Artificial Sweeteners, Coffee Creamer, and Sugar-free Products. This will help patient to have stable blood glucose profile and potentially avoid unintended weight gain.   - I encouraged the patient to switch to unprocessed or minimally processed complex starch and increased protein intake (animal or plant source), fruits, and vegetables.   - Patient is advised to stick to a routine mealtimes to eat 3 meals a day and avoid unnecessary snacks (to snack only to correct hypoglycemia).  - I have approached her with the following individualized plan to manage her diabetes and patient agrees:   -She is advised to continue Metformin  1000 mg ER po twice daily (will consider lowering dose if kidney function declines-notes she has upcoming labs with PCP next week), and Glipizide  5 mg XL daily.    -she is encouraged to start/continue monitoring glucose 2 times daily, before breakfast and before bed, and to call the clinic if she has readings less than 70 or above 300 for 3 tests in a row.  - she is warned not to take insulin without proper monitoring per orders. - Adjustment parameters are given to her for  hypo and hyperglycemia in writing.  -She is not interested in trying GLP1 products.  - Specific targets for  A1c; LDL, HDL, and Triglycerides were discussed with the patient.  2) Blood Pressure /Hypertension:  her blood pressure is controlled to target.   she is advised to continue her current medications as prescribed by her PCP.  3) Lipids/Hyperlipidemia:    Review of her recent lipid panel from 09/11/24 showed uncontrolled LDL at 158 and elevated triglycerides of 178 (worsening).  she is not currently on any lipid lowering medications other than Omega 3 Fatty Acids.  She has upcoming labs with PCP.  4)  Weight/Diet:  her Body mass index is 28.06 kg/m.  -  she is NOT a candidate for major weight loss. I discussed with her the fact that loss of 5 - 10% of her  current body weight will have the most impact on her diabetes management.  Exercise, and detailed carbohydrates information provided  -  detailed on discharge instructions.  5) Chronic Care/Health Maintenance: -she is not on ACEI/ARB or Statin medications and is encouraged to initiate and continue to follow up with Ophthalmology, Dentist, Podiatrist at least yearly or according to recommendations, and advised to stay away from smoking. I have recommended yearly flu vaccine and pneumonia vaccine at least every 5 years; moderate intensity exercise for up to 150 minutes weekly; and sleep  for at least 7 hours a day.  6) Abnormal TSH Her recent labs show abnormal TSH, scheduled to have repeat labs in the next week or so.  I encouraged her to have them send me the labs for our records here as well.    - she is advised to maintain close follow up with Trudy Vaughn FALCON, MD for primary care needs, as well as her other providers for optimal and coordinated care.     I spent  38  minutes in the care of the patient today including review of labs from CMP, Lipids, Thyroid  Function, Hematology (current and previous including abstractions from  other facilities); face-to-face time discussing  her blood glucose readings/logs, discussing hypoglycemia and hyperglycemia episodes and symptoms, medications doses, her options of short and long term treatment based on the latest standards of care / guidelines;  discussion about incorporating lifestyle medicine;  and documenting the encounter. Risk reduction counseling performed per USPSTF guidelines to reduce obesity and cardiovascular risk factors.     Please refer to Patient Instructions for Blood Glucose Monitoring and Insulin/Medications Dosing Guide  in media tab for additional information. Please  also refer to  Patient Self Inventory in the Media  tab for reviewed elements of pertinent patient history.  Rojelio RAMAN Sudduth participated in the discussions, expressed understanding, and voiced agreement with the above plans.  All questions were answered to her satisfaction. she is encouraged to contact clinic should she have any questions or concerns prior to her return visit.    Follow up plan: - Return in about 3 months (around 03/17/2025) for Diabetes F/U with A1c in office, No previsit labs, Bring meter and logs.   Benton Rio, [facility] [address] [place], [place] [address] Phone: [phone] Fax: [phone]  12/17/2024, 2:42 PM    "

## 2025-01-09 ENCOUNTER — Encounter (INDEPENDENT_AMBULATORY_CARE_PROVIDER_SITE_OTHER): Payer: Self-pay | Admitting: Gastroenterology

## 2025-03-25 ENCOUNTER — Ambulatory Visit: Admitting: Nurse Practitioner
